# Patient Record
Sex: Female | Born: 1970 | Race: White | Hispanic: No | Marital: Married | Smoking: Never smoker
Health system: Southern US, Community
[De-identification: ages and names within clinical notes are randomized; demographics above are authoritative.]

## PROBLEM LIST (undated history)

## (undated) DIAGNOSIS — M069 Rheumatoid arthritis, unspecified: Secondary | ICD-10-CM

## (undated) DIAGNOSIS — K5792 Diverticulitis of intestine, part unspecified, without perforation or abscess without bleeding: Secondary | ICD-10-CM

## (undated) HISTORY — PX: KNEE ARTHROSCOPY: SUR90

## (undated) HISTORY — DX: Rheumatoid arthritis, unspecified: M06.9

## (undated) HISTORY — DX: Diverticulitis of intestine, part unspecified, without perforation or abscess without bleeding: K57.92

---

## 1998-07-07 ENCOUNTER — Ambulatory Visit (HOSPITAL_COMMUNITY): Admission: RE | Admit: 1998-07-07 | Discharge: 1998-07-07 | Payer: Self-pay | Admitting: Obstetrics and Gynecology

## 1998-09-26 ENCOUNTER — Inpatient Hospital Stay (HOSPITAL_COMMUNITY): Admission: AD | Admit: 1998-09-26 | Discharge: 1998-09-28 | Payer: Self-pay | Admitting: Obstetrics and Gynecology

## 1998-09-29 ENCOUNTER — Encounter (HOSPITAL_COMMUNITY): Admission: RE | Admit: 1998-09-29 | Discharge: 1998-12-28 | Payer: Self-pay | Admitting: Obstetrics and Gynecology

## 1998-10-29 ENCOUNTER — Other Ambulatory Visit: Admission: RE | Admit: 1998-10-29 | Discharge: 1998-10-29 | Payer: Self-pay | Admitting: Obstetrics & Gynecology

## 1999-12-18 ENCOUNTER — Other Ambulatory Visit: Admission: RE | Admit: 1999-12-18 | Discharge: 1999-12-18 | Payer: Self-pay | Admitting: Obstetrics and Gynecology

## 2001-01-04 ENCOUNTER — Other Ambulatory Visit: Admission: RE | Admit: 2001-01-04 | Discharge: 2001-01-04 | Payer: Self-pay | Admitting: Obstetrics and Gynecology

## 2001-01-06 ENCOUNTER — Encounter: Admission: RE | Admit: 2001-01-06 | Discharge: 2001-01-06 | Payer: Self-pay | Admitting: Obstetrics and Gynecology

## 2001-01-06 ENCOUNTER — Encounter: Payer: Self-pay | Admitting: Obstetrics and Gynecology

## 2001-07-04 ENCOUNTER — Encounter: Payer: Self-pay | Admitting: Obstetrics and Gynecology

## 2001-07-04 ENCOUNTER — Encounter: Admission: RE | Admit: 2001-07-04 | Discharge: 2001-07-04 | Payer: Self-pay | Admitting: Obstetrics and Gynecology

## 2002-02-14 ENCOUNTER — Other Ambulatory Visit: Admission: RE | Admit: 2002-02-14 | Discharge: 2002-02-14 | Payer: Self-pay | Admitting: Obstetrics and Gynecology

## 2003-08-04 ENCOUNTER — Inpatient Hospital Stay (HOSPITAL_COMMUNITY): Admission: AD | Admit: 2003-08-04 | Discharge: 2003-08-07 | Payer: Self-pay | Admitting: Obstetrics and Gynecology

## 2003-09-03 ENCOUNTER — Other Ambulatory Visit: Admission: RE | Admit: 2003-09-03 | Discharge: 2003-09-03 | Payer: Self-pay | Admitting: Obstetrics & Gynecology

## 2004-09-02 ENCOUNTER — Other Ambulatory Visit: Admission: RE | Admit: 2004-09-02 | Discharge: 2004-09-02 | Payer: Self-pay | Admitting: Obstetrics and Gynecology

## 2016-03-23 ENCOUNTER — Ambulatory Visit: Payer: Self-pay | Admitting: Rheumatology

## 2016-04-08 DIAGNOSIS — M0609 Rheumatoid arthritis without rheumatoid factor, multiple sites: Secondary | ICD-10-CM | POA: Insufficient documentation

## 2016-04-08 DIAGNOSIS — M19049 Primary osteoarthritis, unspecified hand: Secondary | ICD-10-CM | POA: Insufficient documentation

## 2016-04-08 DIAGNOSIS — Z79899 Other long term (current) drug therapy: Secondary | ICD-10-CM | POA: Insufficient documentation

## 2016-04-08 NOTE — Progress Notes (Signed)
Office Visit Note  Patient: Michelle Pierce             Date of Birth: 08-08-1970           MRN: [id number]             PCP: Pcp Not In System Referring: No ref. provider found Visit Date: 04/13/2016 Occupation: @GUAROCC @    Subjective:  Left foot pain   History of Present Illness: Michelle CopaKimberly D Kutch is a 45 y.o. female with history of seronegative rheumatoid arthritis. She states she's been doing well except for an episode when she developed pain and swelling in her left second toe. She had some leftover prednisone which she took. She does not have any joint pain or swelling now. She denies morning stiffness or nocturnal pain. She states she does experience some fatigue and dry eyes and dry mouth.   Activities of Daily Living:  Patient reports morning stiffness forminutes.5   Patient Denies nocturnal pain.  Difficulty dressing/grooming: Denies Difficulty climbing stairs: Denies Difficulty getting out of chair: Denies Difficulty using hands for taps, buttons, cutlery, and/or writing: Denies   Review of Systems  Constitutional: Positive for fatigue. Negative for night sweats, weight gain, weight loss and weakness.  HENT: Positive for mouth dryness. Negative for mouth sores, trouble swallowing, trouble swallowing and nose dryness.   Eyes: Positive for dryness. Negative for pain, redness and visual disturbance.  Respiratory: Negative for cough, shortness of breath and difficulty breathing.   Cardiovascular: Negative for chest pain, palpitations, hypertension, irregular heartbeat and swelling in legs/feet.  Gastrointestinal: Negative for blood in stool, constipation and diarrhea.  Endocrine: Negative for increased urination.  Genitourinary: Negative for vaginal dryness.  Musculoskeletal: Positive for arthralgias and joint pain. Negative for joint swelling, myalgias, muscle weakness, morning stiffness, muscle tenderness and myalgias.  Skin: Negative for color change, rash, hair  loss, skin tightness, ulcers and sensitivity to sunlight.  Allergic/Immunologic: Negative for susceptible to infections.  Neurological: Negative for dizziness, memory loss and night sweats.  Hematological: Negative for swollen glands.  Psychiatric/Behavioral: Negative for depressed mood and sleep disturbance. The patient is not nervous/anxious.     PMFS History:  Patient Active Problem List   Diagnosis Date Noted  . Rheumatoid arthritis of multiple sites without rheumatoid factor (HCC) 04/08/2016  . High risk medication use 04/08/2016  . Osteoarthritis, 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    Past Medical History:  Diagnosis Date  . Rheumatoid arthritis (HCC)     Family History  Problem Relation Age of Onset  . Hypertension Mother   . Cancer Father    Past Surgical History:  Procedure Laterality Date  . KNEE ARTHROSCOPY Bilateral    Social History   Social History Narrative  . No narrative on file     Objective: Vital Signs: BP 106/67 (BP Location: Left Arm, Patient Position: Sitting, Cuff Size: Small)   Pulse 74   Resp 14   Ht 5\' 6"  (1.676 m)   Wt 154 lb (69.9 kg)   LMP 03/28/2016   BMI 24.86 kg/m    Physical Exam  Constitutional: She is oriented to person, place, and time. She appears well-developed and well-nourished.  HENT:  Head: Normocephalic and atraumatic.  Eyes: Conjunctivae and EOM are normal.  Neck: Normal range of motion.  Cardiovascular: Normal rate, regular rhythm, normal heart sounds and intact distal pulses.   Pulmonary/Chest: Effort normal and breath sounds normal.  Abdominal: Soft. Bowel sounds are normal.  Lymphadenopathy:    She has no cervical  adenopathy.  Neurological: She is alert and oriented to person, place, and time.  Skin: Skin is warm and dry. Capillary refill takes less than 2 seconds.  Psychiatric: She has a normal mood and affect. Her behavior is normal.  Nursing note and vitals reviewed.    Musculoskeletal Exam: C-spine, thoracic, lumbar spine  good range of motion. Shoulder joints, elbow joints, wrist joints, MCPs PIPs DIPs with good range of motion with no synovitis hip joints, knee joints, ankle joints with good range of motion. She has some tenderness over her left second MTP joint with no synovitis was noted.  CDAI Exam: CDAI Homunculus Exam:   Swelling:  Left foot: 2nd MTP  Joint Counts:  CDAI Tender Joint count: 0 CDAI Swollen Joint count: 0  Global Assessments:  Patient Global Assessment: 2 Provider Global Assessment: 2  CDAI Calculated Score: 4    Investigation: Findings:  Seronegative rheumatoid arthritis. CBC CMP and TB gold 02/11/16 normal     Imaging: No results found.  Speciality Comments: No specialty comments available.    Procedures:  No procedures performed Allergies: Sulfa antibiotics; Factive [gemifloxacin]; and Penicillins   Assessment / Plan: Visit Diagnoses: Rheumatoid arthritis of multiple sites without rheumatoid factor (HCC): She has some no synovitis on examination today she had recent episode foot pain and discomfort in her left second MTP joint. We have decreased her methotrexate to 4 tablets per week I discussed the option of increasing the methotrexate but she would like to wait at this point.  High risk medication use - Enbrel MTX 2.5mg  tablets (4/wk) . Her labs are stable. We'll continue to monitor her labs a standing orders were given to check labs every 3 months.  Sicca symptoms she's been having dry mouth and dry eyes for which over-the-counter products were discussed.   Orders: Orders Placed This Encounter  Procedures  . CBC with Differential/Platelet  . COMPLETE METABOLIC PANEL WITH GFR   No orders of the defined types were placed in this encounter.   Face-to-face time spent with patient was 30 minutes. 50% of time was spent in counseling and coordination of care.  Follow-Up Instructions: Return in about 5 months (around 09/11/2016) for Rheumatoid  arthritis.  Pollyann SavoyShaili Arthurine Oleary, MD

## 2016-04-13 ENCOUNTER — Encounter: Payer: Self-pay | Admitting: Rheumatology

## 2016-04-13 ENCOUNTER — Ambulatory Visit (INDEPENDENT_AMBULATORY_CARE_PROVIDER_SITE_OTHER): Payer: Managed Care, Other (non HMO) | Admitting: Rheumatology

## 2016-04-13 VITALS — BP 106/67 | HR 74 | Resp 14 | Ht 66.0 in | Wt 154.0 lb

## 2016-04-13 DIAGNOSIS — M79672 Pain in left foot: Secondary | ICD-10-CM

## 2016-04-13 DIAGNOSIS — M0609 Rheumatoid arthritis without rheumatoid factor, multiple sites: Secondary | ICD-10-CM | POA: Diagnosis not present

## 2016-04-13 DIAGNOSIS — M19042 Primary osteoarthritis, left hand: Secondary | ICD-10-CM

## 2016-04-13 DIAGNOSIS — Z79899 Other long term (current) drug therapy: Secondary | ICD-10-CM

## 2016-04-13 DIAGNOSIS — M19041 Primary osteoarthritis, right hand: Secondary | ICD-10-CM | POA: Diagnosis not present

## 2016-04-13 NOTE — Patient Instructions (Signed)
Standing Labs We placed an order today for your standing lab work.    Please come back and get your standing labs in December 2017 and every 3 months  We have open lab Monday through Friday from 8:30-11:30 AM and 1-4 PM at the office of Dr. Arbutus PedShaili Deveshwar/Naitik Panwala, PA.   The office is located at [address], [address], [place], [place] [address] No appointment is necessary.   Labs are drawn by [facility].  You may receive a bill from [facility] for your lab work.

## 2016-05-08 ENCOUNTER — Other Ambulatory Visit: Payer: Self-pay | Admitting: Rheumatology

## 2016-05-10 NOTE — Telephone Encounter (Signed)
Last Visit: 04/13/16 Next Visit: 09/15/16 Labs: 02/12/16 WNL Left message to remind patient she is due for labs this month.   Okay to refill MTX?

## 2016-05-10 NOTE — Telephone Encounter (Signed)
ok 

## 2016-05-14 ENCOUNTER — Other Ambulatory Visit: Payer: Self-pay | Admitting: *Deleted

## 2016-05-14 ENCOUNTER — Telehealth: Payer: Self-pay | Admitting: Rheumatology

## 2016-05-14 DIAGNOSIS — Z79899 Other long term (current) drug therapy: Secondary | ICD-10-CM

## 2016-05-14 NOTE — Telephone Encounter (Signed)
Patient needs a lab order faxed to her PCP's office at [phone].

## 2016-05-14 NOTE — Telephone Encounter (Signed)
Lab orders have been faxed to PCP.  

## 2016-08-05 ENCOUNTER — Other Ambulatory Visit: Payer: Self-pay | Admitting: Rheumatology

## 2016-08-05 NOTE — Telephone Encounter (Signed)
Last Visit: 04/13/16 Next Visit: 09/15/16 Labs: 05/14/16 WNL  Okay to refill MTX?

## 2016-08-05 NOTE — Telephone Encounter (Signed)
Ok, labs to this month

## 2016-08-05 NOTE — Telephone Encounter (Signed)
Patient reminded she is due for labs this month.

## 2016-08-11 ENCOUNTER — Encounter: Payer: Self-pay | Admitting: Rheumatology

## 2016-08-13 ENCOUNTER — Telehealth: Payer: Self-pay | Admitting: *Deleted

## 2016-08-13 ENCOUNTER — Telehealth: Payer: Self-pay

## 2016-08-13 NOTE — Telephone Encounter (Signed)
Patient advised CMP/CBC which were drawn on 08/11/16 are WNL. Patient verbalized understanding.

## 2016-08-13 NOTE — Telephone Encounter (Signed)
Received a fax from [facility] regarding a prior authorization approval for Enbrel from 08/12/16 to 08/13/18.   Phone [phone]  Will send document to scan center.  Spoke with patient to update her on the results. She voiced understanding and denies and questions at this time.   Reeves Musick, [place], CPhT  8:40 AM

## 2016-09-02 DIAGNOSIS — M3500 Sicca syndrome, unspecified: Secondary | ICD-10-CM | POA: Insufficient documentation

## 2016-09-02 NOTE — Progress Notes (Deleted)
   Office Visit Note  Patient: Michelle Pierce             Date of Birth: 06/20/1970           MRN: [id number]             PCP: Pcp Not In System Referring: No ref. provider found Visit Date: 09/15/2016 Occupation: @    Subjective:  No chief complaint on file.   History of Present Illness: Michelle Pierce is a 46 y.o. female ***   Activities of Daily Living:  Patient reports morning stiffness for *** {minute/hour:19697}.   Patient {ACTIONS;DENIES/REPORTS:21021675::"Denies"} nocturnal pain.  Difficulty dressing/grooming: {ACTIONS;DENIES/REPORTS:21021675::"Denies"} Difficulty climbing stairs: {ACTIONS;DENIES/REPORTS:21021675::"Denies"} Difficulty getting out of chair: {ACTIONS;DENIES/REPORTS:21021675::"Denies"} Difficulty using hands for taps, buttons, cutlery, and/or writing: {ACTIONS;DENIES/REPORTS:21021675::"Denies"}   No Rheumatology ROS completed.   PMFS History:  Patient Active Problem List   Diagnosis Date Noted  . Sicca syndrome, unspecified (HCC) 09/02/2016  . Rheumatoid arthritis of multiple sites without rheumatoid factor (HCC) 04/08/2016  . High risk medication use 04/08/2016  . Osteoarthritis, 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    Past Medical History:  Diagnosis Date  . Rheumatoid arthritis (HCC)     Family History  Problem Relation Age of Onset  . Hypertension Mother   . Cancer Father    Past Surgical History:  Procedure Laterality Date  . KNEE ARTHROSCOPY Bilateral    Social History   Social History Narrative  . No narrative on file     Objective: Vital Signs: There were no vitals taken for this visit.   Physical Exam   Musculoskeletal Exam: ***  CDAI Exam: No CDAI exam completed.    Investigation: Findings:  CBC CMP from PCP 08/11/2016 WNL (Media tab) TB gold negative 02/11/2016    Imaging: No results found.  Speciality Comments: No specialty comments available.    Procedures:  No procedures performed Allergies: Sulfa  antibiotics; Factive [gemifloxacin]; and Penicillins   Assessment / Plan:     Visit Diagnoses: Rheumatoid arthritis of multiple sites without rheumatoid factor (HCC)  High risk medication use - Methotrexate 4 tablets by mouth every week, Enbrel 50 mg by mouth every week, folic acid 1 mg by mouth daily  Sicca syndrome, unspecified (HCC)  Primary osteoarthritis of both hands    Orders: No orders of the defined types were placed in this encounter.  No orders of the defined types were placed in this encounter.   Face-to-face time spent with patient was *** minutes. 50% of time was spent in counseling and coordination of care.  Follow-Up Instructions: No Follow-up on file.   Pollyann Savoy, MD  Note - This record has been created using Animal nutritionist.  Chart creation errors have been sought, but may not always  have been located. Such creation errors do not reflect on  the standard of medical care.

## 2016-09-15 ENCOUNTER — Ambulatory Visit: Payer: Managed Care, Other (non HMO) | Admitting: Rheumatology

## 2016-10-07 NOTE — Progress Notes (Signed)
Office Visit Note  Patient: Michelle Pierce             Date of Birth: Sep 04, 1970           MRN: [id number]             PCP: System, Pcp Not In Referring: No ref. provider found Visit Date: 10/15/2016 Occupation: @GUAROCC @    Subjective:  Pain left foot.   History of Present Illness: Michelle Pierce is a [age] y.o. female with history of sero positive rheumatoid arthritis. She states she's continues to have some discomfort in her left second toe. It swells intermittently. None of the other joints are painful. She's been tolerating her medications well. She continues to have sicca symptoms. She has extremely dry eyes. She's been having increased lower back discomfort. She denies any radiculopathy. She went to [facility] for evaluation of her lower back. According to patient she had x-ray of her lumbar spine and she was told that it was unremarkable.  Activities of Daily Living:  Patient reports morning stiffness for 0 minute.   Patient Denies nocturnal pain.  Difficulty dressing/grooming: Denies Difficulty climbing stairs: Denies Difficulty getting out of chair: Denies Difficulty using hands for taps, buttons, cutlery, and/or writing: Denies   Review of Systems  Constitutional: Positive for fatigue. Negative for night sweats, weight gain, weight loss and weakness.  HENT: Positive for mouth dryness. Negative for mouth sores, trouble swallowing, trouble swallowing and nose dryness.   Eyes: Positive for dryness. Negative for pain, redness and visual disturbance.  Respiratory: Negative for cough, shortness of breath and difficulty breathing.   Cardiovascular: Negative for chest pain, palpitations, hypertension, irregular heartbeat and swelling in legs/feet.  Gastrointestinal: Negative for blood in stool, constipation and diarrhea.  Endocrine: Negative for increased urination.  Genitourinary: Negative for vaginal dryness.  Musculoskeletal: Positive for arthralgias and  joint pain. Negative for joint swelling, myalgias, muscle weakness, morning stiffness, muscle tenderness and myalgias.  Skin: Negative for color change, rash, hair loss, skin tightness, ulcers and sensitivity to sunlight.  Allergic/Immunologic: Negative for susceptible to infections.  Neurological: Negative for dizziness, memory loss and night sweats.  Hematological: Negative for swollen glands.  Psychiatric/Behavioral: Negative for depressed mood and sleep disturbance. The patient is not nervous/anxious.     PMFS History:  Patient Active Problem List   Diagnosis Date Noted  . Sicca syndrome, unspecified (HCC) 09/02/2016  . Rheumatoid arthritis of multiple sites without rheumatoid factor (HCC) 04/08/2016  . High risk medication use 04/08/2016  . Osteoarthritis, 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    Past Medical History:  Diagnosis Date  . Rheumatoid arthritis (HCC)     Family History  Problem Relation Age of Onset  . Hypertension Mother   . Cancer Father    Past Surgical History:  Procedure Laterality Date  . KNEE ARTHROSCOPY Bilateral    Social History   Social History Narrative  . No narrative on file     Objective: Vital Signs: BP 110/69 (BP Location: Left Arm, Patient Position: Sitting, Cuff Size: Large)   Pulse 68   Resp 12   Ht 5\' 6"  (1.676 m)   Wt 156 lb (70.8 kg)   LMP 10/01/2016 (Approximate)   BMI 25.18 kg/m    Physical Exam  Constitutional: She is oriented to person, place, and time. She appears well-developed and well-nourished.  HENT:  Head: Normocephalic and atraumatic.  Eyes: Conjunctivae and EOM are normal.  Neck: Normal range of motion.  Cardiovascular: Normal rate, regular  rhythm, normal heart sounds and intact distal pulses.   Pulmonary/Chest: Effort normal and breath sounds normal.  Abdominal: Soft. Bowel sounds are normal.  Lymphadenopathy:    She has no cervical adenopathy.  Neurological: She is alert and oriented to person, place, and time.  Skin: Skin is  warm and dry. Capillary refill takes less than 2 seconds.  Psychiatric: She has a normal mood and affect. Her behavior is normal.  Nursing note and vitals reviewed.    Musculoskeletal Exam: C-spine and thoracic lumbar spine good range of motion. Shoulder joints although joints wrist joint MCPs PIPs DIPs are good range of motion with no synovitis. Hip joints knee joints ankles MTPs PIPs are good range of motion. She has tenderness on palpation over left second and fifth MTP joint. No synovitis was noted.  CDAI Exam: CDAI Homunculus Exam:   Tenderness:  Left foot: 2nd MTP and 5th MTP  Joint Counts:  CDAI Tender Joint count: 0 CDAI Swollen Joint count: 0  Global Assessments:  Patient Global Assessment: 3 Provider Global Assessment: 3  CDAI Calculated Score: 6    Investigation: Findings:  08/11/2016 normal CBC / CMP [facility] (media tab)  TB gold 02/11/16 normal     Imaging: No results found.  Speciality Comments: No specialty comments available.    Procedures:  No procedures performed Allergies: Sulfa antibiotics; Factive [gemifloxacin]; and Penicillins   Assessment / Plan:     Visit Diagnoses: Rheumatoid arthritis of multiple sites without rheumatoid factor (HCC): She has no active synovitis today. Although she's been having discomfort in her left foot MTPs. I will schedule ultrasound of her bilateral feet to evaluate this further.  High risk medication use - Enbrel 50 mg subcutaneous every week, methotrexate 4 tablets by mouth every week, folic acid 1 mg by mouth daily -patient decreased her methotrexate every week once back as she was doing well. She was on 7 tablets by mouth every week before. I've advised her to increase her methotrexate to 7 tablets by mouth every week and a new prescription was given. As she is on long-term immunosuppressive therapy we will check TB gold again in September. She will continue to get her labs every 3 months to monitor for drug toxicity.  Plan: Quantiferon tb gold assay (blood)  Sicca syndrome, unspecified (HCC): She continues to have sicca symptoms. She has tried pilocarpine in the past and discontinued. We discussed resuming pilocarpine and also trying over-the-counter medications.  Primary osteoarthritis of both hands: Joint protection and muscle strengthening discussed.  Pain in both feet: I would see response to the increased dose of methotrexate and also we'll schedule ultrasound of her bilateral feet to evaluate further. Proper fitting shoes were discussed.  Chronic midline low back pain without sciatica: Probably related to underlying scoliosis of given her lumbar spine exercises.  Thoracogenic scoliosis of thoracolumbar region    Orders: Orders Placed This Encounter  Procedures  . Quantiferon tb gold assay (blood)   Meds ordered this encounter  Medications  . methotrexate (RHEUMATREX) 2.5 MG tablet    Sig: 7 tablets by mouth every week Caution:Chemotherapy. Protect from light.    Dispense:  84 tablet    Refill:  0    Face-to-face time spent with patient was 30 minutes. 50% of time was spent in counseling and coordination of care.  Follow-Up Instructions: Return in about 5 months (around 03/17/2017) for Rheumatoid arthritis.   Pollyann Savoy, MD  Note - This record has been created using Animal nutritionist.  Chart  creation errors have been sought, but may not always  have been located. Such creation errors do not reflect on  the standard of medical care.

## 2016-10-15 ENCOUNTER — Ambulatory Visit (INDEPENDENT_AMBULATORY_CARE_PROVIDER_SITE_OTHER): Payer: Managed Care, Other (non HMO) | Admitting: Rheumatology

## 2016-10-15 ENCOUNTER — Encounter: Payer: Self-pay | Admitting: Rheumatology

## 2016-10-15 VITALS — BP 110/69 | HR 68 | Resp 12 | Ht 66.0 in | Wt 156.0 lb

## 2016-10-15 DIAGNOSIS — Z79899 Other long term (current) drug therapy: Secondary | ICD-10-CM | POA: Diagnosis not present

## 2016-10-15 DIAGNOSIS — M79672 Pain in left foot: Secondary | ICD-10-CM

## 2016-10-15 DIAGNOSIS — M545 Low back pain: Secondary | ICD-10-CM | POA: Diagnosis not present

## 2016-10-15 DIAGNOSIS — M0609 Rheumatoid arthritis without rheumatoid factor, multiple sites: Secondary | ICD-10-CM

## 2016-10-15 DIAGNOSIS — M19041 Primary osteoarthritis, right hand: Secondary | ICD-10-CM | POA: Diagnosis not present

## 2016-10-15 DIAGNOSIS — M19042 Primary osteoarthritis, left hand: Secondary | ICD-10-CM

## 2016-10-15 DIAGNOSIS — G8929 Other chronic pain: Secondary | ICD-10-CM | POA: Diagnosis not present

## 2016-10-15 DIAGNOSIS — M79671 Pain in right foot: Secondary | ICD-10-CM | POA: Diagnosis not present

## 2016-10-15 DIAGNOSIS — M4135 Thoracogenic scoliosis, thoracolumbar region: Secondary | ICD-10-CM

## 2016-10-15 DIAGNOSIS — M3500 Sicca syndrome, unspecified: Secondary | ICD-10-CM | POA: Diagnosis not present

## 2016-10-15 MED ORDER — METHOTREXATE 2.5 MG PO TABS: ORAL_TABLET | ORAL | 0 refills | Status: DC

## 2016-10-15 NOTE — Patient Instructions (Addendum)
Standing Labs We placed an order today for your standing lab work.    Please come back and get your standing labs in June and every 3 months TB Gold is due in September  We have open lab Monday through Friday from 8:30-11:30 AM and 1:30-4 PM at the office of Dr. Arbutus PedShaili Yuvaan Olander/Naitik Panwala, PA.   The office is located at [address], [address], [place], [place] [address] No appointment is necessary.   Labs are drawn by [facility].  You may receive a bill from [facility] for your lab work. If you have any questions regarding directions or hours of operation,  please call [phone].   Back Exercises The following exercises strengthen the muscles that help to support the back. They also help to keep the lower back flexible. Doing these exercises can help to prevent back pain or lessen existing pain. If you have back pain or discomfort, try doing these exercises 2-3 times each day or as told by your health care provider. When the pain goes away, do them once each day, but increase the number of times that you repeat the steps for each exercise (do more repetitions). If you do not have back pain or discomfort, do these exercises once each day or as told by your health care provider. Exercises Single Knee to Chest   Repeat these steps 3-5 times for each leg: 1. Lie on your back on a firm bed or the floor with your legs extended. 2. Bring one knee to your chest. Your other leg should stay extended and in contact with the floor. 3. Hold your knee in place by grabbing your knee or thigh. 4. Pull on your knee until you feel a gentle stretch in your lower back. 5. Hold the stretch for 10-30 seconds. 6. Slowly release and straighten your leg. Pelvic Tilt   Repeat these steps 5-10 times: 1. Lie on your back on a firm bed or the floor with your legs extended. 2. Bend your knees so they are pointing toward the ceiling and your feet are flat on the floor. 3. Tighten your lower abdominal muscles to  press your lower back against the floor. This motion will tilt your pelvis so your tailbone points up toward the ceiling instead of pointing to your feet or the floor. 4. With gentle tension and even breathing, hold this position for 5-10 seconds. Cat-Cow   Repeat these steps until your lower back becomes more flexible: 1. Get into a hands-and-knees position on a firm surface. Keep your hands under your shoulders, and keep your knees under your hips. You may place padding under your knees for comfort. 2. Let your head hang down, and point your tailbone toward the floor so your lower back becomes rounded like the back of a cat. 3. Hold this position for 5 seconds. 4. Slowly lift your head and point your tailbone up toward the ceiling so your back forms a sagging arch like the back of a cow. 5. Hold this position for 5 seconds. Press-Ups   Repeat these steps 5-10 times: 1. Lie on your abdomen (face-down) on the floor. 2. Place your palms near your head, about shoulder-width apart. 3. While you keep your back as relaxed as possible and keep your hips on the floor, slowly straighten your arms to raise the top half of your body and lift your shoulders. Do not use your back muscles to raise your upper torso. You may adjust the placement of your hands to make yourself  more comfortable. 4. Hold this position for 5 seconds while you keep your back relaxed. 5. Slowly return to lying flat on the floor. Bridges   Repeat these steps 10 times: 1. Lie on your back on a firm surface. 2. Bend your knees so they are pointing toward the ceiling and your feet are flat on the floor. 3. Tighten your buttocks muscles and lift your buttocks off of the floor until your waist is at almost the same height as your knees. You should feel the muscles working in your buttocks and the back of your thighs. If you do not feel these muscles, slide your feet 1-2 inches farther away from your buttocks. 4. Hold this position for  3-5 seconds. 5. Slowly lower your hips to the starting position, and allow your buttocks muscles to relax completely. If this exercise is too easy, try doing it with your arms crossed over your chest. Abdominal Crunches   Repeat these steps 5-10 times: 1. Lie on your back on a firm bed or the floor with your legs extended. 2. Bend your knees so they are pointing toward the ceiling and your feet are flat on the floor. 3. Cross your arms over your chest. 4. Tip your chin slightly toward your chest without bending your neck. 5. Tighten your abdominal muscles and slowly raise your trunk (torso) high enough to lift your shoulder blades a tiny bit off of the floor. Avoid raising your torso higher than that, because it can put too much stress on your low back and it does not help to strengthen your abdominal muscles. 6. Slowly return to your starting position. Back Lifts  Repeat these steps 5-10 times: 1. Lie on your abdomen (face-down) with your arms at your sides, and rest your forehead on the floor. 2. Tighten the muscles in your legs and your buttocks. 3. Slowly lift your chest off of the floor while you keep your hips pressed to the floor. Keep the back of your head in line with the curve in your back. Your eyes should be looking at the floor. 4. Hold this position for 3-5 seconds. 5. Slowly return to your starting position. Contact a health care provider if:  Your back pain or discomfort gets much worse when you do an exercise.  Your back pain or discomfort does not lessen within 2 hours after you exercise. If you have any of these problems, stop doing these exercises right away. Do not do them again unless your health care provider says that you can. Get help right away if:  You develop sudden, severe back pain. If this happens, stop doing the exercises right away. Do not do them again unless your health care provider says that you can. This information is not intended to replace advice  given to you by your health care provider. Make sure you discuss any questions you have with your health care provider. Document Released: 06/17/2004 Document Revised: 09/17/2015 Document Reviewed: 07/04/2014 Elsevier Interactive Patient Education  2017 [employer].

## 2016-10-25 ENCOUNTER — Other Ambulatory Visit: Payer: Self-pay | Admitting: Rheumatology

## 2016-10-25 NOTE — Telephone Encounter (Signed)
Last Visit: 10/15/16 Next Visit: 01/05/17 Labs: 08/11/16 WNL TB Gold: 02/11/16 WNL  Okay to refill Enbrel?

## 2016-10-25 NOTE — Telephone Encounter (Signed)
ok 

## 2016-11-17 ENCOUNTER — Telehealth: Payer: Self-pay | Admitting: Rheumatology

## 2016-11-17 ENCOUNTER — Other Ambulatory Visit: Payer: Self-pay | Admitting: *Deleted

## 2016-11-17 DIAGNOSIS — Z79899 Other long term (current) drug therapy: Secondary | ICD-10-CM

## 2016-11-17 NOTE — Telephone Encounter (Signed)
Patient going to have labs drawn tomorrow or Friday at [facility] in [place]. Request orders to be sent. Call patient if any questions, or concerns.

## 2016-11-17 NOTE — Telephone Encounter (Signed)
Lab results released and faxed.  

## 2016-12-30 DIAGNOSIS — Z8739 Personal history of other diseases of the musculoskeletal system and connective tissue: Secondary | ICD-10-CM | POA: Insufficient documentation

## 2016-12-30 DIAGNOSIS — R7989 Other specified abnormal findings of blood chemistry: Secondary | ICD-10-CM | POA: Insufficient documentation

## 2016-12-30 DIAGNOSIS — R945 Abnormal results of liver function studies: Secondary | ICD-10-CM

## 2016-12-30 DIAGNOSIS — M160 Bilateral primary osteoarthritis of hip: Secondary | ICD-10-CM | POA: Insufficient documentation

## 2016-12-30 NOTE — Progress Notes (Signed)
Office Visit Note  Patient: Michelle Pierce             Date of Birth: 28-Sep-1970           MRN: [id number]             PCP: Hal Morales, NP Referring: No ref. provider found Visit Date: 01/05/2017 Occupation: @GUAROCC @    Subjective:  Medication Management, left hip pain, LBP.   History of Present Illness: Michelle Pierce is a 46 y.o. female with history of seronegative rheumatoid arthritis and osteoarthritis. She states she's been doing quite well on current combination of medications. She denies any joint swelling. She's been having some discomfort in her lower back and in her left hip. Her sicca symptoms are manageable.  Activities of Daily Living:  Patient reports morning stiffness for0  minute.   Patient Denies nocturnal pain.  Difficulty dressing/grooming: Denies Difficulty climbing stairs: Denies Difficulty getting out of chair: Denies Difficulty using hands for taps, buttons, cutlery, and/or writing: Denies   Review of Systems  Constitutional: Negative for fatigue, night sweats, weight gain, weight loss and weakness.  HENT: Negative for mouth sores, trouble swallowing, trouble swallowing, mouth dryness and nose dryness.   Eyes: Negative for pain, redness, visual disturbance and dryness.  Respiratory: Negative for cough, shortness of breath and difficulty breathing.   Cardiovascular: Negative for chest pain, palpitations, hypertension, irregular heartbeat and swelling in legs/feet.  Gastrointestinal: Negative for blood in stool, constipation and diarrhea.  Endocrine: Negative for increased urination.  Genitourinary: Negative for vaginal dryness.  Musculoskeletal: Positive for arthralgias and joint pain. Negative for joint swelling, myalgias, muscle weakness, morning stiffness, muscle tenderness and myalgias.  Skin: Negative for color change, rash, hair loss, skin tightness, ulcers and sensitivity to sunlight.  Allergic/Immunologic: Negative for susceptible to  infections.  Neurological: Negative for dizziness, memory loss and night sweats.  Hematological: Negative for swollen glands.  Psychiatric/Behavioral: Negative for depressed mood and sleep disturbance. The patient is not nervous/anxious.     PMFS History:  Patient Active Problem List   Diagnosis Date Noted  . Primary osteoarthritis of both hips 12/30/2016  . History of scoliosis 12/30/2016  . Elevated LFTs 12/30/2016  . Sicca syndrome, unspecified (HCC) 09/02/2016  . Rheumatoid arthritis of multiple sites without rheumatoid factor (HCC) 04/08/2016  . High risk medication use 04/08/2016  . Osteoarthritis, 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    Past Medical History:  Diagnosis Date  . Rheumatoid arthritis (HCC)     Family History  Problem Relation Age of Onset  . Hypertension Mother   . Cancer Father    Past Surgical History:  Procedure Laterality Date  . KNEE ARTHROSCOPY Bilateral    Social History   Social History Narrative  . No narrative on file     Objective: Vital Signs: BP 120/60   Pulse 74   Resp 14   Ht 5\' 5"  (1.651 m)   Wt 148 lb (67.1 kg)   LMP 12/22/2016 (Approximate)   BMI 24.63 kg/m    Physical Exam  Constitutional: She is oriented to person, place, and time. She appears well-developed and well-nourished.  HENT:  Head: Normocephalic and atraumatic.  Eyes: Conjunctivae and EOM are normal.  Neck: Normal range of motion.  Cardiovascular: Normal rate, regular rhythm, normal heart sounds and intact distal pulses.   Pulmonary/Chest: Effort normal and breath sounds normal.  Abdominal: Soft. Bowel sounds are normal.  Lymphadenopathy:    She has no cervical adenopathy.  Neurological: She is alert  and oriented to person, place, and time.  Skin: Skin is warm and dry. Capillary refill takes less than 2 seconds.  Psychiatric: She has a normal mood and affect. Her behavior is normal.  Nursing note and vitals reviewed.    Musculoskeletal Exam: C-spine and thoracic spine good  range of motion. She is some discomfort range of motion of her lumbar spine. She does have scoliosis in her lumbar region. Shoulder joints, elbow joints, wrist joints, MCPs, PIPs, DIPs with good range of motion with no synovitis. She has discomfort range of motion of her left hip joint. Knee joints, ankle joints, MTPs PIPs with good range of motion with no synovitis.   CDAI Exam: CDAI Homunculus Exam:   Joint Counts:  CDAI Tender Joint count: 0 CDAI Swollen Joint count: 0  Global Assessments:  Patient Global Assessment: 2 Provider Global Assessment: 2  CDAI Calculated Score: 4    Investigation: Findings:  02/09/2016 negative Tb gold   08/11/16 outside labs indicate normal CBC CMP ([facility])    Imaging: Xr Hip Unilat W Or W/o Pelvis 2-3 Views Left  Result Date: 01/05/2017 Moderate to severe left hip joint severe medial narrowing. Mild right superior medial narrowing. Impression moderate to severe osteoarthritis of the left hip  Xr Lumbar Spine 2-3 Views  Result Date: 01/05/2017 Lumbar scoliosis and facet joint arthropathy noted. No significant disc space narrowing was noted.   Speciality Comments: No specialty comments available.    Procedures:  No procedures performed Allergies: Sulfa antibiotics; Factive [gemifloxacin]; and Penicillins   Assessment / Plan:     Visit Diagnoses: Rheumatoid arthritis of multiple sites without rheumatoid factor (HCC): She has no synovitis on examination she seems to doing quite well on current medications.  High risk medication use - Enbrel 50 mg subcutaneous every week, methotrexate 4 tablets by mouth every week, folic acid 1 mg by mouth daily. She states she had labs from July do not have it in our system. She will have the labs mailed to us.  Sicca syndrome, unspecified (HCC): She's been using over-the-counter products which is been helpful.  Pain in left hip. She is painful range of motion of her left hip. - Plan: XR HIP  UNILAT W OR W/O PELVIS 2-3 VIEWS LEFT. She has moderate osteoarthritis of the right hip joint and moderate to severe osteoarthritis of the left hip joint.  Chronic midline low back pain without sciatica - Plan: XR Lumbar Spine 2-3 Views showed scoliosis and facet joint arthropathy.  History of scoliosis  Primary osteoarthritis of both hands: Doing well  Primary osteoarthritis of both hips     Orders: Orders Placed This Encounter  Procedures  . XR HIP UNILAT W OR W/O PELVIS 2-3 VIEWS LEFT  . XR Lumbar Spine 2-3 Views   No orders of the defined types were placed in this encounter.   Face-to-face time spent with patient was 30 minutes. 50% of time was spent in counseling and coordination of care.  Follow-Up Instructions: Return in about 5 months (around 06/07/2017) for Rheumatoid arthritis.   Pollyann SavoyShaili Kingslee Mairena, MD  Note - This record has been created using Animal nutritionistDragon software.  Chart creation errors have been sought, but may not always  have been located. Such creation errors do not reflect on  the standard of medical care.

## 2017-01-05 ENCOUNTER — Ambulatory Visit (INDEPENDENT_AMBULATORY_CARE_PROVIDER_SITE_OTHER): Payer: Managed Care, Other (non HMO) | Admitting: Rheumatology

## 2017-01-05 ENCOUNTER — Encounter: Payer: Self-pay | Admitting: Rheumatology

## 2017-01-05 ENCOUNTER — Ambulatory Visit (INDEPENDENT_AMBULATORY_CARE_PROVIDER_SITE_OTHER): Payer: Self-pay

## 2017-01-05 VITALS — BP 120/60 | HR 74 | Resp 14 | Ht 65.0 in | Wt 148.0 lb

## 2017-01-05 DIAGNOSIS — M19042 Primary osteoarthritis, left hand: Secondary | ICD-10-CM | POA: Diagnosis not present

## 2017-01-05 DIAGNOSIS — G8929 Other chronic pain: Secondary | ICD-10-CM | POA: Diagnosis not present

## 2017-01-05 DIAGNOSIS — M545 Low back pain, unspecified: Secondary | ICD-10-CM

## 2017-01-05 DIAGNOSIS — M25552 Pain in left hip: Secondary | ICD-10-CM

## 2017-01-05 DIAGNOSIS — Z8739 Personal history of other diseases of the musculoskeletal system and connective tissue: Secondary | ICD-10-CM | POA: Diagnosis not present

## 2017-01-05 DIAGNOSIS — Z79899 Other long term (current) drug therapy: Secondary | ICD-10-CM | POA: Diagnosis not present

## 2017-01-05 DIAGNOSIS — M160 Bilateral primary osteoarthritis of hip: Secondary | ICD-10-CM | POA: Diagnosis not present

## 2017-01-05 DIAGNOSIS — M19041 Primary osteoarthritis, right hand: Secondary | ICD-10-CM | POA: Diagnosis not present

## 2017-01-05 DIAGNOSIS — M3500 Sicca syndrome, unspecified: Secondary | ICD-10-CM | POA: Diagnosis not present

## 2017-01-05 DIAGNOSIS — M0609 Rheumatoid arthritis without rheumatoid factor, multiple sites: Secondary | ICD-10-CM | POA: Diagnosis not present

## 2017-01-05 NOTE — Patient Instructions (Signed)
Standing Labs We placed an order today for your standing lab work.    Please come back and get your standing labs in October and every 3 months. TB Gold should be drawn with October labs.  We have open lab Monday through Friday from 8:30-11:30 AM and 1:30-4 PM at the office of Dr. Pollyann SavoyShaili Burnice Oestreicher.   The office is located at [address], [address], [place], [place] [address] No appointment is necessary.   Labs are drawn by [facility].  You may receive a bill from [facility] for your lab work. If you have any questions regarding directions or hours of operation,  please call [phone].

## 2017-01-06 ENCOUNTER — Telehealth: Payer: Self-pay | Admitting: Radiology

## 2017-01-06 NOTE — Telephone Encounter (Signed)
Normal labs CBC CMP received from  [facility]  Sent for scan (12/01/16)

## 2017-01-12 ENCOUNTER — Telehealth: Payer: Self-pay | Admitting: Rheumatology

## 2017-01-12 NOTE — Telephone Encounter (Signed)
Patient advised we have received lab results are normal.

## 2017-01-12 NOTE — Telephone Encounter (Signed)
Patient calling to make sure we received lab results from [facility] in [place], from last week.

## 2017-02-01 ENCOUNTER — Other Ambulatory Visit: Payer: Self-pay | Admitting: Rheumatology

## 2017-02-02 NOTE — Telephone Encounter (Signed)
Last visit 01/05/17 Next visit 06/21/17   CBC CMP 12/01/16 WNL  Ok to refill per Dr Corliss Skainseveshwar

## 2017-03-18 ENCOUNTER — Ambulatory Visit: Payer: Managed Care, Other (non HMO) | Admitting: Rheumatology

## 2017-04-06 ENCOUNTER — Other Ambulatory Visit: Payer: Self-pay | Admitting: Rheumatology

## 2017-04-08 NOTE — Telephone Encounter (Addendum)
Last visit: 01/05/2017 Next visit: 06/21/2017 Labs: 12/01/2016 Cr .53 previously .56 Tb Gold:

## 2017-04-11 NOTE — Telephone Encounter (Signed)
Last visit: 01/05/2017 Next visit: 06/21/2017 Labs: 12/01/2016 Cr .53 previously .56 Tb Gold: 02/09/16 Neg   Patient to update labs this week  Okay to refill 30 day supply per Dr. Corliss Skainseveshwar

## 2017-04-13 ENCOUNTER — Telehealth: Payer: Self-pay | Admitting: Rheumatology

## 2017-04-13 ENCOUNTER — Other Ambulatory Visit: Payer: Self-pay | Admitting: *Deleted

## 2017-04-13 DIAGNOSIS — Z79899 Other long term (current) drug therapy: Secondary | ICD-10-CM

## 2017-04-13 NOTE — Telephone Encounter (Signed)
Patient at [facility]. Patient request an order for TB Gold to be faxed there, so she can get labs drawn. Fax# [phone]

## 2017-04-13 NOTE — Telephone Encounter (Signed)
Lab orders faxed.

## 2017-05-04 ENCOUNTER — Other Ambulatory Visit: Payer: Self-pay | Admitting: Rheumatology

## 2017-05-05 NOTE — Telephone Encounter (Signed)
Last visit: 01/05/2017 Next visit: 06/21/2017 Labs: 12/01/2016 Cr .53 previously .56  Patient states she updated her labs with her PCP. Patient will have copy sent to office.  Okay to refill 30 day supply per Dr. Corliss Skainseveshwar

## 2017-06-02 ENCOUNTER — Other Ambulatory Visit: Payer: Self-pay | Admitting: Rheumatology

## 2017-06-03 NOTE — Telephone Encounter (Signed)
Last Visit: 01/05/17 Next Visit: 06/21/17 Labs: 05/04/17 WNL  Okay to refill per Dr. Corliss Skainseveshwar

## 2017-06-07 NOTE — Progress Notes (Signed)
Office Visit Note  Patient: Michelle Pierce             Date of Birth: [birth year]           MRN: [id number]             PCP: Hal Morales, NP Referring: Hal Morales, NP Visit Date: 06/21/2017 Occupation: @GUAROCC @    Subjective:  Dry eyes   History of Present Illness: Michelle Pierce is a 47 y.o. female with history of seronegative rheumatoid arthritis, sicca syndrome, and osteoarthritis.  Patient states she takes MTX 7 tablets weekly, Enbrel once weekly, and folic acid 2 mg daily. She is due for her Enbrel injection today.  She had labs on 04/29/17. She states that this combination of medications have been controlling her rheumatoid arthritis.  She denies any joint pain or joint swelling.  She denies joint stiffness.  She states that her hips have been doing ok, but her left hip causes discomfort at night when laying on her left side.   She states that her left shoulder also causes discomfort when laying on it.  She reports she still has good ROM.  She states that her left shoulder is slightly tender.  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  She denies any injury or fall.  She states her lower back on the left side still causes occasional discomfort.  She denies any radiation of pain or numbness.  She states that her back feels stiff occasionally.        She continues to have sicca symptoms.  She uses Pilocarpine PRN.  She has not been using OTC products.  She has eye dryness daily and increased thirst.      Activities of Daily Living:  Patient reports morning stiffness for  0 minutes.   Patient Reports nocturnal pain.  Difficulty dressing/grooming: Denies Difficulty climbing stairs: Denies Difficulty getting out of chair: Denies Difficulty using hands for taps, buttons, cutlery, and/or writing: Denies   Review of Systems  Constitutional: Positive for fatigue. Negative for weakness.  HENT: Positive for mouth dryness. Negative for mouth sores and  nose dryness.   Eyes: Positive for dryness. Negative for pain, redness and visual disturbance.  Respiratory: Negative for cough, hemoptysis, shortness of breath and difficulty breathing.   Cardiovascular: Negative for chest pain, palpitations, hypertension, irregular heartbeat and swelling in legs/feet.  Gastrointestinal: Negative for blood in stool, constipation and diarrhea.  Endocrine: Negative for increased urination.  Genitourinary: Negative for painful urination.  Musculoskeletal: Positive for arthralgias and joint pain. Negative for joint swelling, myalgias, muscle weakness, morning stiffness, muscle tenderness and myalgias.  Skin: Negative for color change, pallor, rash, hair loss, nodules/bumps, redness, skin tightness, ulcers and sensitivity to sunlight.  Neurological: Positive for headaches. Negative for dizziness and numbness.  Hematological: Negative for swollen glands.  Psychiatric/Behavioral: Negative for depressed mood and sleep disturbance. The patient is nervous/anxious.     PMFS History:  Patient Active Problem List   Diagnosis Date Noted  . Primary osteoarthritis of both hips 12/30/2016  . History of scoliosis 12/30/2016  . Elevated LFTs 12/30/2016  . Sicca syndrome, unspecified (HCC) 09/02/2016  . Rheumatoid arthritis of multiple sites without rheumatoid factor (HCC) 04/08/2016  . High risk medication use 04/08/2016  . Osteoarthritis, 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    Past Medical History:  Diagnosis Date  . Rheumatoid arthritis (HCC)     Family History  Problem Relation Age of Onset  . Hypertension  Mother   . Cancer Father    Past Surgical History:  Procedure Laterality Date  . KNEE ARTHROSCOPY Bilateral    Social History   Social History Narrative  . Not on file     Objective: Vital Signs: BP 110/68 (BP Location: Left Arm, Patient Position: Sitting, Cuff Size: Normal)   Pulse 72   Resp 16   Ht 5\' 5"  (1.651 m)   Wt 166 lb (75.3 kg)   BMI 27.62 kg/m     Physical Exam  Constitutional: She is oriented to person, place, and time. She appears well-developed and well-nourished.  HENT:  Head: Normocephalic and atraumatic.  Eyes: Conjunctivae and EOM are normal.  Neck: Normal range of motion.  Cardiovascular: Normal rate, regular rhythm, normal heart sounds and intact distal pulses.  Pulmonary/Chest: Effort normal and breath sounds normal.  Abdominal: Soft. Bowel sounds are normal.  Lymphadenopathy:    She has no cervical adenopathy.  Neurological: She is alert and oriented to person, place, and time.  Skin: Skin is warm and dry. Capillary refill takes less than 2 seconds.  Psychiatric: She has a normal mood and affect. Her behavior is normal.  Nursing note and vitals reviewed.    Musculoskeletal Exam: C-spine, thoracic, and lumbar spine good ROM.  Shoulder joints, elbow joints, wrists, MCPs, PIPs, and DIPs good ROM with no synovitis.  She has DIP synovial thickening consistent with osteoarthritis.  Hip joints, knee joints, ankle joints, MTPs, PIPs, and DIPs good ROM with no synovitis.  She has bilateral knee crepitus.  No warmth or effusion.  No midline spinal tenderness.  No SI joint tenderness.  She has left trochanteric bursitis.    CDAI Exam: CDAI Homunculus Exam:   Joint Counts:  CDAI Tender Joint count: 0 CDAI Swollen Joint count: 0  Global Assessments:  Patient Global Assessment: 3 Provider Global Assessment: 3  CDAI Calculated Score: 6    Investigation: No additional findings. Labs: 12/01/2016 Creat 0.53  Imaging: No results found.  Speciality Comments: No specialty comments available.    Procedures:  No procedures performed Allergies: Sulfa antibiotics; Factive [gemifloxacin]; and Penicillins   Assessment / Plan:     Visit Diagnoses: Rheumatoid arthritis of multiple sites without rheumatoid factor (HCC): She has no synovitis on exam.  She is clinically doing well on Enbrel subq, MTX 7 tablets weekly, and  folic acid 2 mg.  A refill of Enbrel was sent to the pharmacy.    High risk medication use - Enbrel, MTX, folic acid -CBC and CMP were WNL on 04/29/17. TB gold was performed on 04/13/17.    Sicca syndrome, unspecified (HCC) -  She uses Pilocarpine PRN.  She no longer uses OTC products.  She continues to have dry eyes and increased thirst.  She was encouraged to try biotene products and OTC eye drops.     Primary osteoarthritis of both hands: She has DIP synovial thickening consistent with osteoarthritis.  She has no discomfort at this time.      Primary osteoarthritis of both hips: She has good ROM bilaterally.  She experiences occasional discomfort in her left hip when laying on the left side at night.  She also has trochanteric bursitis on the left side.    Chronic midline low back pain without sciatica: Chronic discomfort.  No midline spinal tenderness.    History of scoliosis    Orders: No orders of the defined types were placed in this encounter.  Meds ordered this encounter  Medications  . etanercept (  ENBREL SURECLICK) 50 MG/ML injection    Sig: INJECT ONE SURECLICK PEN (50 MG) SUBCUTANEOUSLY ONCE A WEEK. REFRIGERATE. DO NOT FREEZE.    Dispense:  11.76 mL    Refill:  0     Follow-Up Instructions: Return in about 5 months (around 11/19/2017) for Rheumatoid arthritis, Osteoarthritis.   Pollyann Savoy, MD  Note - This record has been created using Animal nutritionist.  Chart creation errors have been sought, but may not always  have been located. Such creation errors do not reflect on  the standard of medical care.

## 2017-06-21 ENCOUNTER — Encounter: Payer: Self-pay | Admitting: Rheumatology

## 2017-06-21 ENCOUNTER — Ambulatory Visit: Payer: Managed Care, Other (non HMO) | Admitting: Rheumatology

## 2017-06-21 VITALS — BP 110/68 | HR 72 | Resp 16 | Ht 65.0 in | Wt 166.0 lb

## 2017-06-21 DIAGNOSIS — M3500 Sicca syndrome, unspecified: Secondary | ICD-10-CM

## 2017-06-21 DIAGNOSIS — M19041 Primary osteoarthritis, right hand: Secondary | ICD-10-CM

## 2017-06-21 DIAGNOSIS — M0609 Rheumatoid arthritis without rheumatoid factor, multiple sites: Secondary | ICD-10-CM

## 2017-06-21 DIAGNOSIS — M160 Bilateral primary osteoarthritis of hip: Secondary | ICD-10-CM

## 2017-06-21 DIAGNOSIS — Z79899 Other long term (current) drug therapy: Secondary | ICD-10-CM | POA: Diagnosis not present

## 2017-06-21 DIAGNOSIS — G8929 Other chronic pain: Secondary | ICD-10-CM

## 2017-06-21 DIAGNOSIS — M545 Low back pain, unspecified: Secondary | ICD-10-CM

## 2017-06-21 DIAGNOSIS — Z8739 Personal history of other diseases of the musculoskeletal system and connective tissue: Secondary | ICD-10-CM

## 2017-06-21 DIAGNOSIS — M19042 Primary osteoarthritis, left hand: Secondary | ICD-10-CM | POA: Diagnosis not present

## 2017-06-21 MED ORDER — ETANERCEPT 50 MG/ML ~~LOC~~ SOAJ: SUBCUTANEOUS | 0 refills | Status: DC

## 2017-08-04 ENCOUNTER — Other Ambulatory Visit: Payer: Self-pay | Admitting: Rheumatology

## 2017-08-04 NOTE — Telephone Encounter (Addendum)
Last Visit: 06/21/17 Next Visit: 11/16/17 Labs: 04/29/18 WNL  Left message to advise patient we need updated labs.   Okay to refill 30 day supply per Dr. Corliss Skainseveshwar

## 2017-08-24 ENCOUNTER — Other Ambulatory Visit: Payer: Self-pay | Admitting: Physician Assistant

## 2017-08-24 NOTE — Telephone Encounter (Addendum)
Left message for patient to call the office. Advised she will need to update labs.

## 2017-08-27 ENCOUNTER — Encounter: Payer: Self-pay | Admitting: Rheumatology

## 2017-09-02 ENCOUNTER — Other Ambulatory Visit: Payer: Self-pay | Admitting: Rheumatology

## 2017-09-15 ENCOUNTER — Other Ambulatory Visit: Payer: Self-pay | Admitting: Physician Assistant

## 2017-09-15 NOTE — Telephone Encounter (Addendum)
Last Visit: 06/21/17 Next Visit: 11/16/17 Labs:04/29/17 WNL TB Gold: 04/29/17 Neg  Left message to advise patient we need updated labs. She states she updated labs and will have then faxed.   Okay to refill 30 day supply per Dr. Corliss Skainseveshwar

## 2017-10-03 ENCOUNTER — Other Ambulatory Visit: Payer: Self-pay | Admitting: Rheumatology

## 2017-10-03 NOTE — Telephone Encounter (Signed)
Last Visit: 06/21/17 Next Visit: 11/16/17 Labs: 08/27/17 WNL  Okay to refill per Dr. Corliss Skains

## 2017-10-04 ENCOUNTER — Telehealth: Payer: Self-pay | Admitting: *Deleted

## 2017-10-04 NOTE — Telephone Encounter (Signed)
contacted patient and advised patient based off of labs she should decrease MTX to 6 tabs per week. Patient verbalized understanding.

## 2017-10-10 ENCOUNTER — Other Ambulatory Visit: Payer: Self-pay | Admitting: Rheumatology

## 2017-10-10 NOTE — Telephone Encounter (Addendum)
Last Visit: 06/21/17 Next Visit: 11/16/17 Labs: 08/27/17 WNL TB Gold: 04/29/17 Neg   Okay to refill per Dr. Corliss Skains

## 2017-10-27 ENCOUNTER — Other Ambulatory Visit: Payer: Self-pay | Admitting: Obstetrics and Gynecology

## 2017-10-27 DIAGNOSIS — R928 Other abnormal and inconclusive findings on diagnostic imaging of breast: Secondary | ICD-10-CM

## 2017-10-31 ENCOUNTER — Ambulatory Visit
Admission: RE | Admit: 2017-10-31 | Discharge: 2017-10-31 | Disposition: A | Discharge status: Home / Self Care | Payer: Managed Care, Other (non HMO) | Source: Ambulatory Visit | Attending: Obstetrics and Gynecology | Admitting: Obstetrics and Gynecology

## 2017-10-31 ENCOUNTER — Other Ambulatory Visit: Payer: Self-pay | Admitting: Rheumatology

## 2017-10-31 DIAGNOSIS — R928 Other abnormal and inconclusive findings on diagnostic imaging of breast: Secondary | ICD-10-CM

## 2017-10-31 NOTE — Telephone Encounter (Signed)
Last Visit: 06/21/17 Next Visit: 11/16/17 Labs:08/27/17 WNL   Okay to refill per Dr. Corliss Skainseveshwar

## 2017-11-07 NOTE — Progress Notes (Signed)
Office Visit Note  Patient: Michelle Pierce             Date of Birth: 09-05-1970           MRN: [id number]             PCP: Hal Morales, NP Referring: Hal Morales, NP Visit Date: 11/16/2017 Occupation: @GUAROCC @    Subjective:  Medication management   History of Present Illness: Michelle Pierce is a 47 y.o. female with history of seronegative rheumatoid arthritis and osteoarthritis.  She states she takes her Enbrel approximately every other week and we had to reduce methotrexate to 6 tablets/week due to mild elevation of ALT.  She denies any joint pain or joint swelling.  She continues to have some discomfort in her hip joints due to underlying osteoarthritis.  She was taking pilocarpine for sicca symptoms which she discontinued that she felt it was not effective.  She continues to have sicca symptoms.  She continues to have some numbness which she experiences in her left first second and third digits.  She is on computer during the daytime.  She also complains of some left trapezius spasm.  Just some shortness of breath off and on for the last 2 to 3 months.  Activities of Daily Living:  Patient reports morning stiffness for 0 minute.   Patient Denies nocturnal pain.  Difficulty dressing/grooming: Denies Difficulty climbing stairs: Denies Difficulty getting out of chair: Denies Difficulty using hands for taps, buttons, cutlery, and/or writing: Denies   Review of Systems  Constitutional: Negative for fatigue, fever, night sweats, weight gain and weight loss.  HENT: Negative for ear pain, mouth sores, trouble swallowing, trouble swallowing, mouth dryness and nose dryness.   Eyes: Positive for pain. Negative for redness, visual disturbance and dryness.  Respiratory: Positive for shortness of breath. Negative for cough and difficulty breathing.        Last 2 months  Cardiovascular: Negative for chest pain, palpitations, hypertension, irregular heartbeat and swelling in  legs/feet.  Gastrointestinal: Negative for blood in stool, constipation and diarrhea.  Endocrine: Negative for increased urination.  Genitourinary: Negative for difficulty urinating and vaginal dryness.  Musculoskeletal: Positive for arthralgias, joint pain and muscle weakness. Negative for joint swelling, myalgias, morning stiffness, muscle tenderness and myalgias.  Skin: Negative for color change, rash, hair loss, skin tightness, ulcers and sensitivity to sunlight.  Allergic/Immunologic: Negative for susceptible to infections.  Neurological: Positive for weakness. Negative for dizziness, memory loss and night sweats.  Hematological: Negative for bruising/bleeding tendency and swollen glands.  Psychiatric/Behavioral: Negative for depressed mood and sleep disturbance. The patient is not nervous/anxious.     PMFS History:  Patient Active Problem List   Diagnosis Date Noted  . Primary osteoarthritis of both hips 12/30/2016  . History of scoliosis 12/30/2016  . Elevated LFTs 12/30/2016  . Sicca syndrome, unspecified (HCC) 09/02/2016  . Rheumatoid arthritis of multiple sites without rheumatoid factor (HCC) 04/08/2016  . High risk medication use 04/08/2016  . Osteoarthritis, 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    Past Medical History:  Diagnosis Date  . Rheumatoid arthritis (HCC)     Family History  Problem Relation Age of Onset  . Hypertension Mother   . Cancer Father    Past Surgical History:  Procedure Laterality Date  . KNEE ARTHROSCOPY Bilateral    Social History   Social History Narrative  . Not on file     Objective: Vital Signs: BP 120/83 (BP Location: Right Arm, Patient Position: Sitting,  Cuff Size: Normal)   Pulse 71   Ht 5\' 5"  (1.651 m)   Wt 164 lb (74.4 kg)   BMI 27.29 kg/m    Physical Exam  Constitutional: She is oriented to person, place, and time. She appears well-developed and well-nourished.  HENT:  Head: Normocephalic and atraumatic.  Eyes: Conjunctivae and EOM are  normal.  Neck: Normal range of motion.  Cardiovascular: Normal rate, regular rhythm, normal heart sounds and intact distal pulses.  Pulmonary/Chest: Effort normal and breath sounds normal.  Abdominal: Soft. Bowel sounds are normal.  Lymphadenopathy:    She has no cervical adenopathy.  Neurological: She is alert and oriented to person, place, and time.  Skin: Skin is warm and dry. Capillary refill takes less than 2 seconds.  Psychiatric: She has a normal mood and affect. Her behavior is normal.  Nursing note and vitals reviewed.    Musculoskeletal Exam: C-spine thoracic lumbar spine good range of motion.  Shoulder joints elbow joints wrist joint MCPs PIPs DIPs with good range of motion with no synovitis.  Hip joints knee joints ankles MTPs PIPs DIPs were in good range of motion with no synovitis.  CDAI Exam: CDAI Homunculus Exam:   Joint Counts:  CDAI Tender Joint count: 0 CDAI Swollen Joint count: 0  Global Assessments:  Patient Global Assessment: 3 Provider Global Assessment: 1  CDAI Calculated Score: 4    Investigation: No additional findings.    Imaging: [employer] Breast [employer] Uni Left Inc Axilla  Result Date: 10/31/2017 CLINICAL DATA:  47 year old female for evaluation of possible LEFT breast mass on screening mammogram. EXAM: DIGITAL DIAGNOSTIC LEFT MAMMOGRAM AND TOMO ULTRASOUND LEFT BREAST COMPARISON:  Previous exam(s). ACR Breast Density Category c: The breast tissue is heterogeneously dense, which may obscure small masses. FINDINGS: 2D/3D spot compression views of the LEFT breast demonstrate a faint circumscribed oval mass within the UPPER-OUTER LEFT breast. Targeted ultrasound is performed, showing a 9 x 5 x 8 mm benign simple cyst at the 2 o'clock position of the LEFT breast 7 cm from the nipple, corresponding to the screening study finding. IMPRESSION: Benign cyst within the UPPER-OUTER LEFT breast corresponding to the screening study finding. RECOMMENDATION: Bilateral  screening mammograms in 1 year. I have discussed the findings and recommendations with the patient. Results were also provided in writing at the conclusion of the visit. If applicable, a reminder letter will be sent to the patient regarding the next appointment. BI-RADS CATEGORY  2: Benign. Electronically Signed   By: Harmon PierJeffrey  Hu M.D.   On: 10/31/2017 09:19   Mm Diag Breast Tomo Uni Left  Result Date: 10/31/2017 CLINICAL DATA:  47 year old female for evaluation of possible LEFT breast mass on screening mammogram. EXAM: DIGITAL DIAGNOSTIC LEFT MAMMOGRAM AND TOMO ULTRASOUND LEFT BREAST COMPARISON:  Previous exam(s). ACR Breast Density Category c: The breast tissue is heterogeneously dense, which may obscure small masses. FINDINGS: 2D/3D spot compression views of the LEFT breast demonstrate a faint circumscribed oval mass within the UPPER-OUTER LEFT breast. Targeted ultrasound is performed, showing a 9 x 5 x 8 mm benign simple cyst at the 2 o'clock position of the LEFT breast 7 cm from the nipple, corresponding to the screening study finding. IMPRESSION: Benign cyst within the UPPER-OUTER LEFT breast corresponding to the screening study finding. RECOMMENDATION: Bilateral screening mammograms in 1 year. I have discussed the findings and recommendations with the patient. Results were also provided in writing at the conclusion of the visit. If applicable, a reminder letter will be sent  to the patient regarding the next appointment. BI-RADS CATEGORY  2: Benign. Electronically Signed   By: Harmon Pier M.D.   On: 10/31/2017 09:19    Speciality Comments: No specialty comments available.    Procedures:  No procedures performed Allergies: Sulfa antibiotics; Factive [gemifloxacin]; and Penicillins   Assessment / Plan:     Visit Diagnoses: Rheumatoid arthritis of multiple sites without rheumatoid factor (HCC)-patient has no active synovitis on examination.  Her rheumatoid arthritis seems to be quite well  controlled.  She has been using Enbrel only every other week and has reduced methotrexate to 6 tablets/week due to mild elevation of ALT.  She will continue on current regimen for right now.  High risk medication use - Enbrel subcutaneous injection every other week or weekly, MTX 6 tablets weekly, and folic acid 2 mg daily.  We will get labs today and then every 3 months to monitor for drug toxicity.  She also request hemoglobin A1c should be checked.  Elevated LFTs-patient of LFTs.  We will continue to monitor that.  Paresthesia left hand-Allen's test, Tinel's test was negative.  Manual compression test was negative.  I will schedule nerve conduction velocity to evaluate this further.  Primary osteoarthritis of both hips-she continues to have mild discomfort.  Sicca syndrome, unspecified (HCC)-she has discontinued pilocarpine.  She continues to have some sicca symptoms.  Over-the-counter products were discussed.  She is been experiencing shortness of breath off and on for the last 3 months.  Have advised her to schedule appointment with her PCP.  I will also get a  chest x-ray today.  History of scoliosis    Orders: Orders Placed This Encounter  Procedures  . DG Chest 2 View  . CBC with Differential/Platelet  . COMPLETE METABOLIC PANEL WITH GFR  . HgB A1c  . Ambulatory referral to Physical Medicine Rehab   No orders of the defined types were placed in this encounter.   Face-to-face time spent with patient was 30 minutes. >50% of time was spent in counseling and coordination of care.  Follow-Up Instructions: Return in about 5 months (around 04/18/2018) for Rheumatoid arthritis, Osteoarthritis.   Pollyann Savoy, MD  Note - This record has been created using Animal nutritionist.  Chart creation errors have been sought, but may not always  have been located. Such creation errors do not reflect on  the standard of medical care.

## 2017-11-16 ENCOUNTER — Ambulatory Visit (HOSPITAL_COMMUNITY)
Admission: RE | Admit: 2017-11-16 | Discharge: 2017-11-16 | Disposition: A | Discharge status: Home / Self Care | Payer: Managed Care, Other (non HMO) | Source: Ambulatory Visit | Attending: Physician Assistant | Admitting: Physician Assistant

## 2017-11-16 ENCOUNTER — Encounter: Payer: Self-pay | Admitting: Rheumatology

## 2017-11-16 ENCOUNTER — Ambulatory Visit (INDEPENDENT_AMBULATORY_CARE_PROVIDER_SITE_OTHER): Payer: Managed Care, Other (non HMO) | Admitting: Rheumatology

## 2017-11-16 VITALS — BP 120/83 | HR 71 | Ht 65.0 in | Wt 164.0 lb

## 2017-11-16 DIAGNOSIS — M0609 Rheumatoid arthritis without rheumatoid factor, multiple sites: Secondary | ICD-10-CM | POA: Diagnosis not present

## 2017-11-16 DIAGNOSIS — R0602 Shortness of breath: Secondary | ICD-10-CM | POA: Diagnosis not present

## 2017-11-16 DIAGNOSIS — Z79899 Other long term (current) drug therapy: Secondary | ICD-10-CM

## 2017-11-16 DIAGNOSIS — R945 Abnormal results of liver function studies: Secondary | ICD-10-CM

## 2017-11-16 DIAGNOSIS — M3500 Sicca syndrome, unspecified: Secondary | ICD-10-CM | POA: Diagnosis not present

## 2017-11-16 DIAGNOSIS — R202 Paresthesia of skin: Secondary | ICD-10-CM | POA: Diagnosis not present

## 2017-11-16 DIAGNOSIS — M4185 Other forms of scoliosis, thoracolumbar region: Secondary | ICD-10-CM | POA: Insufficient documentation

## 2017-11-16 DIAGNOSIS — Z8739 Personal history of other diseases of the musculoskeletal system and connective tissue: Secondary | ICD-10-CM | POA: Diagnosis not present

## 2017-11-16 DIAGNOSIS — M160 Bilateral primary osteoarthritis of hip: Secondary | ICD-10-CM | POA: Diagnosis not present

## 2017-11-16 DIAGNOSIS — R7989 Other specified abnormal findings of blood chemistry: Secondary | ICD-10-CM

## 2017-11-16 DIAGNOSIS — Z5181 Encounter for therapeutic drug level monitoring: Secondary | ICD-10-CM

## 2017-11-16 NOTE — Progress Notes (Signed)
I discussed CXR results with Dr. Corliss Skainseveshwar today. CXR did not reveal any active disease.  No findings consistent with ILD. She has mild biapical pleural based scarring.  Please send results to PCP.

## 2017-11-16 NOTE — Patient Instructions (Signed)
Standing Labs We placed an order today for your standing lab work.    Please come back and get your standing labs in September and every 3 months. TB Gold is due in November 2019.  We have open lab Monday through Friday from 8:30-11:30 AM and 1:30-4:00 PM  at the office of Dr. Pollyann SavoyShaili Fares Ramthun.   You may experience shorter wait times on Monday and Friday afternoons. The office is located at [address], [address], [place], [place] [address] No appointment is necessary.   Labs are drawn by [facility].  You may receive a bill from [facility] for your lab work. If you have any questions regarding directions or hours of operation,  please call [phone].

## 2017-11-17 ENCOUNTER — Telehealth: Payer: Self-pay | Admitting: Physician Assistant

## 2017-11-17 LAB — CBC WITH DIFFERENTIAL/PLATELET
BASOS ABS: 30 {cells}/uL (ref 0–200)
Basophils Relative: 0.5 %
Eosinophils Absolute: 270 cells/uL (ref 15–500)
Eosinophils Relative: 4.5 %
HEMATOCRIT: 39.4 % (ref 35.0–45.0)
Hemoglobin: 13.1 g/dL (ref 11.7–15.5)
LYMPHS ABS: 2052 {cells}/uL (ref 850–3900)
MCH: 29.6 pg (ref 27.0–33.0)
MCHC: 33.2 g/dL (ref 32.0–36.0)
MCV: 89.1 fL (ref 80.0–100.0)
MPV: 10 fL (ref 7.5–12.5)
Monocytes Relative: 5.8 %
Neutro Abs: 3300 cells/uL (ref 1500–7800)
Neutrophils Relative %: 55 %
Platelets: 311 10*3/uL (ref 140–400)
RBC: 4.42 10*6/uL (ref 3.80–5.10)
RDW: 11.7 % (ref 11.0–15.0)
TOTAL LYMPHOCYTE: 34.2 %
WBC: 6 10*3/uL (ref 3.8–10.8)
WBCMIX: 348 {cells}/uL (ref 200–950)

## 2017-11-17 LAB — HEMOGLOBIN A1C
EAG (MMOL/L): 5.2 (calc)
HEMOGLOBIN A1C: 4.9 %{Hb} (ref <5.7)
Mean Plasma Glucose: 94 (calc)

## 2017-11-17 LAB — COMPLETE METABOLIC PANEL WITH GFR
AG RATIO: 1.5 (calc) (ref 1.0–2.5)
ALT: 152 U/L — ABNORMAL HIGH (ref 6–29)
AST: 118 U/L — AB (ref 10–35)
Albumin: 4.4 g/dL (ref 3.6–5.1)
Alkaline phosphatase (APISO): 99 U/L (ref 33–115)
BILIRUBIN TOTAL: 0.6 mg/dL (ref 0.2–1.2)
BUN: 9 mg/dL (ref 7–25)
CALCIUM: 9.4 mg/dL (ref 8.6–10.2)
CO2: 29 mmol/L (ref 20–32)
Chloride: 105 mmol/L (ref 98–110)
Creat: 0.6 mg/dL (ref 0.50–1.10)
GFR, EST NON AFRICAN AMERICAN: 110 mL/min/{1.73_m2} (ref >=60)
GFR, Est African American: 128 mL/min/{1.73_m2} (ref >=60)
GLOBULIN: 2.9 g/dL (ref 1.9–3.7)
Glucose, Bld: 97 mg/dL (ref 65–99)
POTASSIUM: 4.9 mmol/L (ref 3.5–5.3)
SODIUM: 141 mmol/L (ref 135–146)
Total Protein: 7.3 g/dL (ref 6.1–8.1)

## 2017-11-17 NOTE — Progress Notes (Signed)
CBC WNL.  Hgb A1c 4.9.  Please send results to PCP.   LFTs are very elevated.  Advise patient to discontinue MTX and Robaxin.  We will recheck LFTs in 1 week.  If her LFTs continue to be elevated we will refer to GI

## 2017-11-17 NOTE — Telephone Encounter (Signed)
I called the patient to discuss her recent lab work performed yesterday.  She was notified that the CBC within normal limits and her hemoglobin A1c was 4.9.  We will send these results to her PCP.  We discussed the increase in her liver functions.  She was notified to discontinue methotrexate at this time.  She was also advised to not take Tylenol, NSAIDs, or drink alcohol.   She will also avoid taking Robaxin. We will recheck her LFTs in about 1 week.  She will be going to town so she will not be doing that her lab work to the following Monday.  She would also like a copy of her labs mailed to her.  Orders will be sent to [facility] in [place] to have her lab work performed.  All questions were addressed.

## 2017-11-28 ENCOUNTER — Other Ambulatory Visit: Payer: Self-pay

## 2017-11-28 ENCOUNTER — Telehealth: Payer: Self-pay | Admitting: Rheumatology

## 2017-11-28 DIAGNOSIS — Z79899 Other long term (current) drug therapy: Secondary | ICD-10-CM

## 2017-11-28 NOTE — Telephone Encounter (Signed)
Patient called requesting labwork orders be sent to [facility] in [place] on [address].  Patient states she has an appointment tomorrow morning 11/29/17.

## 2017-11-28 NOTE — Telephone Encounter (Signed)
Lab orders have been faxed. Patient has been notified.  

## 2017-12-09 ENCOUNTER — Telehealth: Payer: Self-pay | Admitting: Rheumatology

## 2017-12-09 NOTE — Telephone Encounter (Signed)
Patient requesting a call back with results from bloodwork done in [place] on Wednesday 11/30/17, at [facility], [facility]. Please call patient to inform. Patient states results were to be sent to us.

## 2017-12-12 NOTE — Telephone Encounter (Signed)
Patient advised of lab results. Patient would like to know if she can give it a trial staying off of her MTX or should she go back on her MTX now that her liver functions have returned to normal.

## 2017-12-12 NOTE — Telephone Encounter (Signed)
Ok to hold MTX dose if she would like to at this time but she should continue injecting Enbrel 50 mg subcutaneously every other week.  If she develops increased joint pain or joint swelling she should resume MTX.  She should notify us if she develops any worsening symptoms.  We will continue to monitor labs.

## 2017-12-12 NOTE — Telephone Encounter (Signed)
CMP WNL Drawn on 11/30/17

## 2017-12-12 NOTE — Telephone Encounter (Signed)
Patient advised.

## 2017-12-15 ENCOUNTER — Telehealth (INDEPENDENT_AMBULATORY_CARE_PROVIDER_SITE_OTHER): Payer: Self-pay | Admitting: *Deleted

## 2017-12-15 NOTE — Telephone Encounter (Signed)
Pt called to cancel appt and would like to r/s at a different time.

## 2017-12-20 ENCOUNTER — Encounter (INDEPENDENT_AMBULATORY_CARE_PROVIDER_SITE_OTHER): Payer: Self-pay | Admitting: Physical Medicine and Rehabilitation

## 2017-12-22 ENCOUNTER — Encounter: Payer: Self-pay | Admitting: Rheumatology

## 2018-01-20 ENCOUNTER — Ambulatory Visit: Payer: Managed Care, Other (non HMO) | Admitting: Pulmonary Disease

## 2018-01-20 ENCOUNTER — Encounter: Payer: Self-pay | Admitting: Pulmonary Disease

## 2018-01-20 VITALS — BP 110/64 | HR 87 | Ht 65.0 in | Wt 163.8 lb

## 2018-01-20 DIAGNOSIS — R0600 Dyspnea, unspecified: Secondary | ICD-10-CM

## 2018-01-20 NOTE — Patient Instructions (Signed)
Shortness of breath: As we described today there are many potential causes for this.  Because you have taken methotrexate and you have a history of rheumatoid arthritis we need to make sure there is not evidence of underlying lung disease. We will check a lung function test We will check a high-resolution CT scan of the chest We will check your oxygen level while walking today.  We will have you come back in 1 to 2 weeks to go over these results.

## 2018-01-20 NOTE — Progress Notes (Signed)
Synopsis: Referred in August 2019 for shortness of breath, has a history of seronegative rheumatoid arthritis and has been treated with Humira, methotrexate and Enbrel.  Subjective:   PATIENT ID: Michelle Pierce GENDER: female DOB: 1971/01/22, MRN: [id number]   HPI  Chief Complaint  Patient presents with  . Pulmonary Consult    self referral, SOB, hard to catch her breath, has to breathe in deeply, hacking cough , tickening on lungs from xray done in June    This is a pleasant 47 year old female who comes to my clinic today for evaluation of shortness of breath.  She says that she first noticed the shortness of breath several months ago.  She says that it has occurred on exertion some but is most predictable with stress and heat.  She describes a dry cough but no mucus production.  She has no baseline lung problems.  She says that as a child she never had asthma or shortness of breath.  She has had pneumonia once in her life but was not hospitalized for it.  She says that she has a diagnosis of seronegative rheumatoid arthritis.  She was diagnosed in her early 7520s.  She had bilateral knee swelling and despite some arthroscopic procedures there is no clear evidence of an underlying structural abnormality.  Eventually she was seen by rheumatologist who diagnosed her with seronegative rheumatoid arthritis.  She was initially started on Humira which caused resolution of her symptoms but she developed worsening hand symptoms.  She was ultimately started on methotrexate.  She is not sure how long she use the methotrexate for but she says that it was quite effective at controlling her wrist and knee swelling and pain.  However, this year she developed elevated LFTs and the dose of methotrexate had to be adjusted.  The liver function abnormalities worsened and ultimately methotrexate was stopped.  Of note, she is currently taking Enbrel for her rheumatoid arthritis.  She says that her rheumatoid  arthritis has been well controlled recently with no leg or wrist swelling.  She is not aware of it ever involving her lungs.  She describes no mucus production.  No chest tightness, no leg swelling.  Past Medical History:  Diagnosis Date  . Rheumatoid arthritis (HCC)      Family History  Problem Relation Age of Onset  . Hypertension Mother   . Cancer Father      Social History   Socioeconomic History  . Marital status: Married    Spouse name: Not on file  . Number of children: Not on file  . Years of education: Not on file  . Highest education level: Not on file  Occupational History  . Not on file  Social Needs  . Financial resource strain: Not on file  . Food insecurity:    Worry: Not on file    Inability: Not on file  . Transportation needs:    Medical: Not on file    Non-medical: Not on file  Tobacco Use  . Smoking status: Never Smoker  . Smokeless tobacco: Never Used  Substance and Sexual Activity  . Alcohol use: No  . Drug use: No  . Sexual activity: Yes    Partners: Male    Birth control/protection: Other-see comments    Comment: Partner had vasectomy   Lifestyle  . Physical activity:    Days per week: Not on file    Minutes per session: Not on file  . Stress: Not on file  Relationships  .  Social connections:    Talks on phone: Not on file    Gets together: Not on file    Attends religious service: Not on file    Active member of club or organization: Not on file    Attends meetings of clubs or organizations: Not on file    Relationship status: Not on file  . Intimate partner violence:    Fear of current or ex partner: Not on file    Emotionally abused: Not on file    Physically abused: Not on file    Forced sexual activity: Not on file  Other Topics Concern  . Not on file  Social History Narrative  . Not on file     Allergies  Allergen Reactions  . Sulfa Antibiotics Other (See Comments)    Elevated AST/ALT  . Factive [Gemifloxacin] Hives    . Penicillins Hives     Outpatient Medications Prior to Visit  Medication Sig Dispense Refill  . etanercept (ENBREL SURECLICK) 50 MG/ML injection INJECT ONE SURECLICK PEN SUBCUTANEOUSLY ONCE A WEEK. REFRIGERATE. DO NOT FREEZE. 12 Syringe 0  . fexofenadine (ALLEGRA) 180 MG tablet Take 180 mg by mouth daily.    . folic acid (FOLVITE) 1 MG tablet TAKE TWO TABLETS BY MOUTH EVERY DAY 180 tablet 3  . lisdexamfetamine (VYVANSE) 50 MG capsule Vyvanse 50 mg capsule    . methocarbamol (ROBAXIN) 500 MG tablet Take 500 mg by mouth as needed for muscle spasms.    . methotrexate (RHEUMATREX) 2.5 MG tablet TAKE 7 TABLETS BY MOUTH WEEKLY. PROTECT FROM LIGHT (Patient not taking: Reported on 01/20/2018) 28 tablet 0   No facility-administered medications prior to visit.     Review of Systems  Constitutional: Negative for chills, fever, malaise/fatigue and weight loss.  HENT: Negative for congestion, nosebleeds, sinus pain and sore throat.   Eyes: Negative for photophobia, pain and discharge.  Respiratory: Positive for shortness of breath. Negative for cough, hemoptysis, sputum production and wheezing.   Cardiovascular: Negative for chest pain, palpitations, orthopnea and leg swelling.  Gastrointestinal: Negative for abdominal pain, constipation, diarrhea, nausea and vomiting.  Genitourinary: Negative for dysuria, frequency, hematuria and urgency.  Musculoskeletal: Negative for back pain, joint pain, myalgias and neck pain.  Skin: Negative for itching and rash.  Neurological: Positive for headaches. Negative for tingling, tremors, sensory change, speech change, focal weakness, seizures and weakness.  Psychiatric/Behavioral: Negative for memory loss, substance abuse and suicidal ideas. The patient is not nervous/anxious.       Objective:  Physical Exam   Vitals:   01/20/18 1608  BP: 110/64  Pulse: 87  SpO2: 97%  Weight: 163 lb 12.8 oz (74.3 kg)  Height: 5\' 5"  (1.651 m)    RA  Walked 500  feet on RA and O2 saturation remained above 98%  Gen: well appearing, no acute distress HENT: NCAT, OP clear, neck supple without masses Eyes: PERRL, EOMi Lymph: no cervical lymphadenopathy PULM: CTA B CV: RRR, no mgr, no JVD GI: BS+, soft, nontender, no hsm Derm: no rash or skin breakdown MSK: normal bulk and tone Neuro: A&Ox4, CN II-XII intact, strength 5/5 in all 4 extremities Psyche: normal mood and affect   CBC    Component Value Date/Time   WBC 6.0 11/16/2017 0849   RBC 4.42 11/16/2017 0849   HGB 13.1 11/16/2017 0849   HCT 39.4 11/16/2017 0849   PLT 311 11/16/2017 0849   MCV 89.1 11/16/2017 0849   MCH 29.6 11/16/2017 0849   MCHC 33.2 11/16/2017  0849   RDW 11.7 11/16/2017 0849   LYMPHSABS 2,052 11/16/2017 0849   EOSABS 270 11/16/2017 0849   BASOSABS 30 11/16/2017 0849     Chest imaging: June 2019 chest x-ray images independently reviewed showing some mild biapical pleural thickening, otherwise no clear pulmonary parenchymal abnormality  PFT:  Labs:  Path:  Echo:  Heart Catheterization:  Records from her visit with rheumatology earlier this month reviewed where she was treated with Enbrel and methotrexate for rheumatoid arthritis     Assessment & Plan:   Dyspnea, unspecified type - Plan: Pulmonary function test  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  I explained to her that the causes of shortness of breath or many, and include lung problems, heart problems, blood problems, neuromuscular weakness.  In her particular case it is not really clear to me what is causing this.  Confounding factors is that she has been through a lot of stress in the last year and she notes that the shortness of breath occurs sometimes with stress and and heat.  She does not have a  predictable exertional dyspnea.  In her particular case I am concerned about her history of rheumatoid arthritis and methotrexate use as both of these can lead to lung problems.  Fortunately I have no objective abnormality to suggest that right now.  I question whether or not the change in methotrexate manufactures in the last 12 months could have been involved with the fact that she is had an unusual reaction this year: Perhaps this has something to do with her shortness of breath?.  Plan: Shortness of breath: As we described today there are many potential causes for this.  Because you have taken methotrexate and you have a history of rheumatoid arthritis we need to make sure there is not evidence of underlying lung disease. We will check a lung function test We will check a high-resolution CT scan of the chest We will check your oxygen level while walking today.  We will have you come back in 1 to 2 weeks to go over these results.    Current Outpatient Medications:  .  etanercept (ENBREL SURECLICK) 50 MG/ML injection, INJECT ONE SURECLICK PEN SUBCUTANEOUSLY ONCE A WEEK. REFRIGERATE. DO NOT FREEZE., Disp: 12 Syringe, Rfl: 0 .  fexofenadine (ALLEGRA) 180 MG tablet, Take 180 mg by mouth daily., Disp: , Rfl:  .  folic acid (FOLVITE) 1 MG tablet, TAKE TWO TABLETS BY MOUTH EVERY DAY, Disp: 180 tablet, Rfl: 3 .  lisdexamfetamine (VYVANSE) 50 MG capsule, Vyvanse 50 mg capsule, Disp: , Rfl:  .  methotrexate (RHEUMATREX) 2.5 MG tablet, TAKE 7 TABLETS BY MOUTH WEEKLY. PROTECT FROM LIGHT (Patient not taking: Reported on 01/20/2018), Disp: 28 tablet, Rfl: 0

## 2018-01-31 ENCOUNTER — Other Ambulatory Visit: Payer: Self-pay | Admitting: Rheumatology

## 2018-01-31 NOTE — Telephone Encounter (Signed)
Last visit: 11/16/17 Next Visit: 04/03/18  Okay to refill per Dr. Corliss Skains

## 2018-02-01 ENCOUNTER — Ambulatory Visit (INDEPENDENT_AMBULATORY_CARE_PROVIDER_SITE_OTHER)
Admission: RE | Admit: 2018-02-01 | Discharge: 2018-02-01 | Disposition: A | Discharge status: Home / Self Care | Payer: Managed Care, Other (non HMO) | Source: Ambulatory Visit | Attending: Pulmonary Disease | Admitting: Pulmonary Disease

## 2018-02-01 DIAGNOSIS — R0600 Dyspnea, unspecified: Secondary | ICD-10-CM | POA: Diagnosis not present

## 2018-02-02 ENCOUNTER — Inpatient Hospital Stay: Admission: RE | Admit: 2018-02-02 | Payer: Managed Care, Other (non HMO) | Source: Ambulatory Visit

## 2018-02-03 ENCOUNTER — Ambulatory Visit: Payer: Managed Care, Other (non HMO) | Admitting: Primary Care

## 2018-02-06 ENCOUNTER — Ambulatory Visit (INDEPENDENT_AMBULATORY_CARE_PROVIDER_SITE_OTHER): Payer: Managed Care, Other (non HMO) | Admitting: Pulmonary Disease

## 2018-02-06 ENCOUNTER — Ambulatory Visit (INDEPENDENT_AMBULATORY_CARE_PROVIDER_SITE_OTHER): Payer: Managed Care, Other (non HMO) | Admitting: Primary Care

## 2018-02-06 ENCOUNTER — Telehealth: Payer: Self-pay | Admitting: Primary Care

## 2018-02-06 ENCOUNTER — Encounter: Payer: Self-pay | Admitting: Primary Care

## 2018-02-06 VITALS — BP 118/70 | HR 75 | Ht 65.0 in | Wt 163.0 lb

## 2018-02-06 DIAGNOSIS — R0600 Dyspnea, unspecified: Secondary | ICD-10-CM

## 2018-02-06 DIAGNOSIS — M0609 Rheumatoid arthritis without rheumatoid factor, multiple sites: Secondary | ICD-10-CM

## 2018-02-06 DIAGNOSIS — F439 Reaction to severe stress, unspecified: Secondary | ICD-10-CM

## 2018-02-06 LAB — PULMONARY FUNCTION TEST
DL/VA % PRED: 112 %
DL/VA: 5.54 ml/min/mmHg/L
DLCO UNC: 23.83 ml/min/mmHg
DLCO unc % pred: 93 %
FEF 25-75 POST: 3.3 L/s
FEF 25-75 Pre: 2.3 L/sec
FEF2575-%Change-Post: 43 %
FEF2575-%PRED-POST: 110 %
FEF2575-%PRED-PRE: 76 %
FEV1-%Change-Post: 8 %
FEV1-%Pred-Post: 96 %
FEV1-%Pred-Pre: 88 %
FEV1-Post: 2.89 L
FEV1-Pre: 2.65 L
FEV1FVC-%Change-Post: 8 %
FEV1FVC-%PRED-PRE: 98 %
FEV6-%Change-Post: 0 %
FEV6-%PRED-POST: 91 %
FEV6-%PRED-PRE: 90 %
FEV6-POST: 3.34 L
FEV6-Pre: 3.32 L
FEV6FVC-%Change-Post: 0 %
FEV6FVC-%PRED-POST: 102 %
FEV6FVC-%Pred-Pre: 101 %
FVC-%Change-Post: 0 %
FVC-%PRED-PRE: 89 %
FVC-%Pred-Post: 89 %
FVC-POST: 3.34 L
FVC-PRE: 3.33 L
POST FEV6/FVC RATIO: 100 %
Post FEV1/FVC ratio: 87 %
Pre FEV1/FVC ratio: 80 %
Pre FEV6/FVC Ratio: 100 %
RV % pred: 63 %
RV: 1.12 L
TLC % PRED: 85 %
TLC: 4.42 L

## 2018-02-06 NOTE — Progress Notes (Signed)
PFT done today. 

## 2018-02-06 NOTE — Patient Instructions (Signed)
HRCT and PFTs were normal  Consider continuing counseling for stress management   FU as needed with Dr. Kendrick FriesMcQuaid

## 2018-02-06 NOTE — Assessment & Plan Note (Signed)
Stress at home with son's UC diagnosis  Advise patient to continue counseling Discuss other options with PCP

## 2018-02-06 NOTE — Assessment & Plan Note (Addendum)
Followed by Rheumatology Stopped Methotrexate d/t elevated LFTs Currently on Enbrel  HRCT showed no ILD

## 2018-02-06 NOTE — Telephone Encounter (Signed)
Dr. Kendrick FriesMcQuaid,  I saw your patient for follow-up office visit regarding dyspnea. She is doing well overall, breathing has improved. No reports of sob. She has a lot of stress at home which she thinks is contributing to her breathing. PFTs showed no restriction or obstruction. Normal DLCO. HRCT showed no ILD, mild enlargement of her heart. She has a lot of medical bills d/t her son's UC treatment so I told her I would ask you if you felt ECHO was necessary at this time.   -Beth

## 2018-02-06 NOTE — Progress Notes (Signed)
@Patient  ID: Michelle Pierce, female    DOB: 09/03/1970, 47 y.o.   MRN: [id number]  Chief Complaint  Patient presents with  . Follow-up    follow up HRCT and PFT, infrequent issues catching breath, feels breathing is doing well    Referring provider: Hal MoralesGunter, Tara G, NP  HPI: 47 year old female, never smoked. PMH seronegative rheumatoid arthritis. New consult (self referral) of Dr. Kendrick FriesMcQuaid, originally seen on 01/20/18 for sob and hacking cough. Sob first occurred a few months ago, experiences with exertion but most predicable with stress and heat. No history of asthma, has had pneumonia once but was not hospitalized. No mucus production or chest tightness. Plan obtain PFTs, HRCT prior to next visit in September.   Seen by Rheumatology and dx with seronegative rheumatoid arthrosis. Initially started on Humira with resolution of symptoms but later developed worsening hand symptoms. She was ultimately then started on methotrexate with good control of her symptoms but developed elevated LFTs and methotrexate was discontinued. She is currently taking Enbrel for her RA.   02/06/2018 Patient presents for follow-up visit today. Completed PFTs and HRCT. Breathing is ok, states that its not nearly as bad as it was. She has to take a deep breath every once in awhile. Thinks shortness of breath symptoms are associated with stress and anxiety. States that her son has ulcerative colitis and has been on and off prednisone/medication, requiring occasional hospitalizations. This has been going on for awhile.   Significant testing reviewed: >> CXR 06/19- No evidence of active disease. Mild biapical leural based scarring. No generalized interstitial opacity or fibrotic change. Thoracolumbar scoliosis.   >> HRCT 02/01/18- No evidence of ILD. No pulmonary parenchymal findings to explain the patient's symptoms. Reversal of the normal lower thoracic kyphosis resulting in decreased AP diameter of the chest. Mild  cardiomegaly. 1.6 cm fluid density nodule in the left adrenal gland   >> PFTs- FVC 3.34 (89%), FEV1 2.89 (96%), ratio 87, DLCO 25.6 (93%).   Allergies  Allergen Reactions  . Sulfa Antibiotics Other (See Comments)    Elevated AST/ALT  . Factive [Gemifloxacin] Hives  . Penicillins Hives     There is no immunization history on file for this patient.  Past Medical History:  Diagnosis Date  . Rheumatoid arthritis (HCC)     Tobacco History: Social History   Tobacco Use  Smoking Status Never Smoker  Smokeless Tobacco Never Used   Counseling given: Not Answered   Outpatient Medications Prior to Visit  Medication Sig Dispense Refill  . etanercept (ENBREL SURECLICK) 50 MG/ML injection INJECT ONE SURECLICK PEN SUBCUTANEOUSLY ONCE A WEEK. REFRIGERATE. DO NOT FREEZE. 12 Syringe 0  . fexofenadine (ALLEGRA) 180 MG tablet Take 180 mg by mouth daily.    . folic acid (FOLVITE) 1 MG tablet TAKE TWO TABLETS BY MOUTH EVERY DAY 180 tablet 3  . lisdexamfetamine (VYVANSE) 50 MG capsule Vyvanse 50 mg capsule    . methotrexate (RHEUMATREX) 2.5 MG tablet TAKE 7 TABLETS BY MOUTH WEEKLY. PROTECT FROM LIGHT (Patient not taking: Reported on 01/20/2018) 28 tablet 0   No facility-administered medications prior to visit.     Review of Systems  Review of Systems  HENT: Negative.   Cardiovascular: Negative.   Psychiatric/Behavioral: The patient is nervous/anxious.     Physical Exam  BP 118/70 (BP Location: Left Arm, Patient Position: Sitting, Cuff Size: Normal)   Pulse 75   Ht 5\' 5"  (1.651 m)   Wt 163 lb (73.9 kg)  SpO2 100%   BMI 27.12 kg/m  Physical Exam  Constitutional: She is oriented to person, place, and time. She appears well-developed and well-nourished.  HENT:  Head: Normocephalic and atraumatic.  Eyes: Pupils are equal, round, and reactive to light. EOM are normal.  Neck: Normal range of motion. Neck supple.  Cardiovascular: Normal rate, regular rhythm and normal heart sounds.    No murmur heard. Pulmonary/Chest: Effort normal and breath sounds normal. No respiratory distress. She has no wheezes.  Abdominal: Soft. Bowel sounds are normal. There is no tenderness.  Neurological: She is alert and oriented to person, place, and time.  Skin: Skin is warm and dry. No rash noted. No erythema.  Psychiatric: She has a normal mood and affect. Her behavior is normal. Judgment normal.     Lab Results:  CBC    Component Value Date/Time   WBC 6.0 11/16/2017 0849   RBC 4.42 11/16/2017 0849   HGB 13.1 11/16/2017 0849   HCT 39.4 11/16/2017 0849   PLT 311 11/16/2017 0849   MCV 89.1 11/16/2017 0849   MCH 29.6 11/16/2017 0849   MCHC 33.2 11/16/2017 0849   RDW 11.7 11/16/2017 0849   LYMPHSABS 2,052 11/16/2017 0849   EOSABS 270 11/16/2017 0849   BASOSABS 30 11/16/2017 0849    BMET    Component Value Date/Time   NA 141 11/16/2017 0849   K 4.9 11/16/2017 0849   CL 105 11/16/2017 0849   CO2 29 11/16/2017 0849   GLUCOSE 97 11/16/2017 0849   BUN 9 11/16/2017 0849   CREATININE 0.60 11/16/2017 0849   CALCIUM 9.4 11/16/2017 0849   GFRNONAA 110 11/16/2017 0849   GFRAA 128 11/16/2017 0849    BNP No results found for: BNP  ProBNP No results found for: PROBNP  Imaging: Ct Chest High Resolution  Result Date: 02/01/2018 CLINICAL DATA:  Increasing shortness of breath and dry cough. Rheumatoid arthritis. EXAM: CT CHEST WITHOUT CONTRAST TECHNIQUE: Multidetector CT imaging of the chest was performed following the standard protocol without intravenous contrast. High resolution imaging of the lungs, as well as inspiratory and expiratory imaging, was performed. COMPARISON:  None. FINDINGS: Cardiovascular: Vascular structures are unremarkable. Heart is at the upper limits of normal in size to mildly enlarged. No pericardial effusion. Mediastinum/Nodes: No pathologically enlarged mediastinal or axillary lymph nodes. Hilar regions are difficult to definitively evaluate without IV  contrast but appear grossly unremarkable. Esophagus is grossly unremarkable. Lungs/Pleura: Mild biapical pleuroparenchymal scarring. Negative for subpleural reticulation, traction bronchiectasis/bronchiolectasis, ground-glass, architectural distortion or honeycombing. Subpleural nodules measure up to 4 mm, indicative of benign subpleural lymph nodes. Lungs are otherwise clear. No pleural fluid. Airway is unremarkable. No air trapping. Upper Abdomen: Visualized portions of the liver, gallbladder and right adrenal gland are unremarkable. 1.6 cm fluid density nodule in the left adrenal gland. Visualized portion of the right kidney is unremarkable. Subcentimeter low-attenuation lesion in the left kidney is too small to characterize. Visualized portions of the spleen, pancreas, stomach and bowel are grossly unremarkable. No upper abdominal adenopathy. Musculoskeletal: Reversal of the normal lower thoracic kyphosis with decreased AP diameter of the chest. Although there is not a pectus deformity, Haller index is 3.4 for reference. Osseous structures are otherwise unremarkable. IMPRESSION: 1. No evidence of interstitial lung disease. No pulmonary parenchymal findings to explain the patient's symptoms. 2. Reversal of the normal lower thoracic kyphosis resulting in decreased AP diameter of the chest. Please see discussion above and correlate clinically. 3. Left adrenal adenoma. Electronically Signed   By:  Leanna Battles M.D.   On: 02/01/2018 09:53     Assessment & Plan:  47 year old female, hx RA previously on Methotrexate. Self referred to pulmonary d/t sob over the last few months. Sig stress at home d/t son's dx of ulcerative colitis. She is feeling better. No shortness of breath today, occasionally she has to catch her breath but that is very infrequently. PFTs completed today showing no restriction or obstruction. Normal DLCO. HRCT showed no evidence of ILD. Mild heart enlargement, consider echocardiogram in the  future if sob symptoms persist. Will discuss with Dr. Henrene Pastor, I don't feel its necessary at this time. Otherwise, FU with pulmonary only as needed.    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  HRCT and PFTs normal Mildly enlarged heart on HRCT, consider ECHO (will discuss with Dr. Kendrick Fries) FU only as needed with Pulmonary office     Stress at home Stress at home with son's UC diagnosis  Advise patient to continue counseling Discuss other options with PCP      Glenford Bayley, NP 02/06/2018

## 2018-02-06 NOTE — Assessment & Plan Note (Addendum)
Dyspnea has improved, no breathing issues today  PFTs- FVC 3.34 (89%), FEV1 2.89 (96%), ratio 87, DLCO 25.6 (93%).  HRCT and PFTs normal Mildly enlarged heart on HRCT, consider ECHO (will discuss with Dr. Kendrick FriesMcQuaid) FU only as needed with Pulmonary office

## 2018-02-08 NOTE — Telephone Encounter (Signed)
OK to hold off on echo

## 2018-02-08 NOTE — Progress Notes (Signed)
Reviewed, agree 

## 2018-03-02 ENCOUNTER — Telehealth: Payer: Self-pay | Admitting: Rheumatology

## 2018-03-02 NOTE — Telephone Encounter (Signed)
Opened in error

## 2018-03-20 ENCOUNTER — Other Ambulatory Visit: Payer: Self-pay | Admitting: Rheumatology

## 2018-03-20 NOTE — Telephone Encounter (Addendum)
Last visit: 11/16/17 Next Visit: 04/03/18 Labs: 11/30/17 WNL TB Gold: 04/29/17 Neg   Left message to advise patient she is due for labs.  Okay to refill 30 day supply per Dr. Corliss Skains

## 2018-03-20 NOTE — Progress Notes (Signed)
Office Visit Note  Patient: Michelle Pierce             Date of Birth: [birth year]           MRN: [id number]             PCP: Charlynn Court, NP Referring: Charlynn Court, NP Visit Date: 04/03/2018 Occupation: _0 @  Subjective:  Pain in hands.   History of Present Illness: Michelle Pierce is a 47 y.o. female with history of seronegative rheumatoid arthritis and osteoarthritis.  Patient has been off methotrexate since June 2019 due to elevation of LFTs.  At the time she was on methotrexate 8 tablets p.o. weekly along with Enbrel.  She continues to take Enbrel.  She states she has had occasional discomfort in her wrist and hands but no joint swelling.  She frequently has pain in the trapezius area for which she was taking anti-inflammatories .  She still takes some NSAIDs on PRN basis.  She continues to have some sicca symptoms but they are tolerable.  The paresthesias in her left hand has resolved after doing massage.  She did not get the nerve conduction velocity test.  Activities of Daily Living:  Patient reports morning stiffness for 0 minutes.   Patient Denies nocturnal pain.  Difficulty dressing/grooming: Denies Difficulty climbing stairs: Denies Difficulty getting out of chair: Denies Difficulty using hands for taps, buttons, cutlery, and/or writing: Denies  Review of Systems  Constitutional: Positive for fatigue.  HENT: Positive for mouth dryness. Negative for mouth sores, trouble swallowing and trouble swallowing.   Eyes: Positive for dryness. Negative for pain, redness and itching.  Respiratory: Positive for shortness of breath. Negative for wheezing and difficulty breathing.        Work-up by pulmonologist was negative.  Cardiovascular: Negative for chest pain, palpitations and swelling in legs/feet.  Gastrointestinal: Negative for abdominal pain, constipation, diarrhea, nausea and vomiting.  Endocrine: Negative for increased urination.  Genitourinary: Negative  for painful urination, nocturia and pelvic pain.  Musculoskeletal: Positive for arthralgias and joint pain. Negative for joint swelling and morning stiffness.  Skin: Positive for hair loss. Negative for rash.  Allergic/Immunologic: Negative for susceptible to infections.  Neurological: Positive for headaches. Negative for dizziness, light-headedness, memory loss and weakness.  Hematological: Positive for bruising/bleeding tendency.  Psychiatric/Behavioral: Negative for confusion. The patient is not nervous/anxious.     PMFS History:  Patient Active Problem List   Diagnosis Date Noted  . Dyspnea 02/06/2018  . Stress at home 02/06/2018  . Primary osteoarthritis of both hips 12/30/2016  . History of scoliosis 12/30/2016  . Elevated LFTs 12/30/2016  . Sicca syndrome, unspecified ([place]) 09/02/2016  . Rheumatoid arthritis of multiple sites without rheumatoid factor ([place]) 04/08/2016  . High risk medication use 04/08/2016  . Osteoarthritis, 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    Past Medical History:  Diagnosis Date  . Rheumatoid arthritis ([place])     Family History  Problem Relation Age of Onset  . Hypertension Mother   . Cancer Father    Past Surgical History:  Procedure Laterality Date  . KNEE ARTHROSCOPY Bilateral    Social History   Social History Narrative  . Not on file    Objective: Vital Signs: BP 128/80 (BP Location: Left Arm, Patient Position: Sitting, Cuff Size: Normal)   Pulse 78   Resp 13   Ht _1  (1.676 m)   Wt 168 lb 12.8 oz (76.6 kg)   BMI 27.25 kg/m    Physical Exam  Constitutional: She is oriented to person, place, and time. She appears well-developed and well-nourished.  HENT:  Head: Normocephalic and atraumatic.  Eyes: Conjunctivae and EOM are normal.  Neck: Normal range of motion.  Cardiovascular: Normal rate, regular rhythm, normal heart sounds and intact distal pulses.  Pulmonary/Chest: Effort normal and breath sounds normal.  Abdominal: Soft. Bowel sounds are  normal.  Lymphadenopathy:    She has no cervical adenopathy.  Neurological: She is alert and oriented to person, place, and time.  Skin: Skin is warm and dry. Capillary refill takes less than 2 seconds.  Psychiatric: She has a normal mood and affect. Her behavior is normal.  Nursing note and vitals reviewed.    Musculoskeletal Exam: C-spine thoracic lumbar spine good range of motion.  Shoulder joints elbow joints wrist joint MCPs PIPs DIPs are in good range of motion with no synovitis.  Hip joints knee joints ankles MTPs PIPs were in good range of motion with no synovitis.  CDAI Exam: CDAI Score: 0.6  Patient Global Assessment: 3 (mm); Provider Global Assessment: 3 (mm) Swollen: 0 ; Tender: 0  Joint Exam   Not documented   There is currently no information documented on the homunculus. Go to the Rheumatology activity and complete the homunculus joint exam.  Investigation: No additional findings.  Imaging: No results found.  Recent Labs: Lab Results  Component Value Date   WBC 6.0 11/16/2017   HGB 13.1 11/16/2017   PLT 311 11/16/2017   NA 141 11/16/2017   K 4.9 11/16/2017   CL 105 11/16/2017   CO2 29 11/16/2017   GLUCOSE 97 11/16/2017   BUN 9 11/16/2017   CREATININE 0.60 11/16/2017   BILITOT 0.6 11/16/2017   AST 118 (H) 11/16/2017   ALT 152 (H) 11/16/2017   PROT 7.3 11/16/2017   CALCIUM 9.4 11/16/2017   GFRAA 128 11/16/2017  10/2017 CBC normal hemoglobin 13.6 platelets 300, CMP creatinine 0.76, AST 23, ALT 21  Speciality Comments: No specialty comments available.  Procedures:  No procedures performed Allergies: Sulfa antibiotics; Factive [gemifloxacin]; and Penicillins   Assessment / Plan:     Visit Diagnoses: Rheumatoid arthritis of multiple sites without rheumatoid factor (HCC)-patient has no synovitis on examination.  She has been having increased arthralgias since she has been off methotrexate.  We will hold off methotrexate for right now as she had elevated  LFTs.  Repeat LFTs are normal.  She also takes NSAIDs for headaches.  I have given her a list of natural anti-inflammatories to try.  Elevated LFTs-resolved on most recent labs.  High risk medication use - Enteral 50 mg subcu weekly.  Methotrexate was held due to elevation of LFTs. - Plan: QuantiFERON-TB Gold Plus, CBC with Differential/Platelet, CMP14+EGFR will be drawn in February.  She will continue to get labs every 3 months.  Primary osteoarthritis of both hips-she has some discomfort off and on.  She had good range of motion in her hip joints.  Sicca syndrome, unspecified (HCC)-over-the-counter products were discussed.  History of scoliosis-she has some lower back discomfort.  Orders: Orders Placed This Encounter  Procedures  . QuantiFERON-TB Gold Plus  . CBC with Differential/Platelet  . CMP14+EGFR   No orders of the defined types were placed in this encounter.     Follow-Up Instructions: Return in about 5 months (around 09/02/2018) for Rheumatoid arthritis, Osteoarthritis.   Bo Merino, MD  Note - This record has been created using Editor, commissioning.  Chart creation errors have been sought, but may not always  have been located. Such creation errors do not reflect on  the standard of medical care.

## 2018-03-29 ENCOUNTER — Telehealth: Payer: Self-pay | Admitting: Rheumatology

## 2018-03-29 NOTE — Telephone Encounter (Signed)
Patient left a voicemail stating she had her bloodwork done yesterday and is requesting a return call when you receive it so she can refill her medication.

## 2018-03-31 NOTE — Telephone Encounter (Signed)
Patient advised we have received lab results and they are stable. Labs were drawn on 03/29/18. Patient advised we sent 30 day supply of Enbrel on 03/20/18. Patient states she just received medication.

## 2018-04-03 ENCOUNTER — Ambulatory Visit: Payer: Managed Care, Other (non HMO) | Admitting: Rheumatology

## 2018-04-03 ENCOUNTER — Encounter: Payer: Self-pay | Admitting: Rheumatology

## 2018-04-03 VITALS — BP 128/80 | HR 78 | Resp 13 | Ht 66.0 in | Wt 168.8 lb

## 2018-04-03 DIAGNOSIS — M3500 Sicca syndrome, unspecified: Secondary | ICD-10-CM

## 2018-04-03 DIAGNOSIS — M0609 Rheumatoid arthritis without rheumatoid factor, multiple sites: Secondary | ICD-10-CM | POA: Diagnosis not present

## 2018-04-03 DIAGNOSIS — Z8739 Personal history of other diseases of the musculoskeletal system and connective tissue: Secondary | ICD-10-CM

## 2018-04-03 DIAGNOSIS — M160 Bilateral primary osteoarthritis of hip: Secondary | ICD-10-CM | POA: Diagnosis not present

## 2018-04-03 DIAGNOSIS — Z79899 Other long term (current) drug therapy: Secondary | ICD-10-CM

## 2018-04-03 NOTE — Patient Instructions (Signed)
Standing Labs We placed an order today for your standing lab work.    Please come back and get your standing labs in February and every 3 months TB Gold is due with next labs.  We have open lab Monday through Friday from 8:30-11:30 AM and 1:30-4:00 PM  at the office of Dr. Pollyann Savoy.   You may experience shorter wait times on Monday and Friday afternoons. The office is located at [address], [address], [place], [place] [address] No appointment is necessary.   Labs are drawn by [facility].  You may receive a bill from [facility] for your lab work. If you have any questions regarding directions or hours of operation,  please call [phone].   Just as a reminder please drink plenty of water prior to coming for your lab work. Thanks!

## 2018-04-06 ENCOUNTER — Other Ambulatory Visit: Payer: Self-pay | Admitting: Rheumatology

## 2018-04-06 NOTE — Telephone Encounter (Signed)
Last Visit: 04/03/18 Next Visit: 09/04/18 Labs: 03/29/18 stable TB Gold:04/29/17 Neg   Okay to refill per Dr. Corliss Skainseveshwar

## 2018-07-03 ENCOUNTER — Other Ambulatory Visit: Payer: Self-pay | Admitting: Rheumatology

## 2018-07-03 DIAGNOSIS — Z79899 Other long term (current) drug therapy: Secondary | ICD-10-CM

## 2018-07-04 ENCOUNTER — Encounter: Payer: Self-pay | Admitting: *Deleted

## 2018-07-04 NOTE — Telephone Encounter (Signed)
Last Visit: 04/03/18 Next Visit: 09/04/18 Labs: 03/29/18 stable TB Gold:04/29/17 Neg  Patient advised she is due for labs. Patient will update this week.   Okay to refill 30 day supply per Dr. Corliss Skains

## 2018-07-27 DIAGNOSIS — R5381 Other malaise: Secondary | ICD-10-CM | POA: Insufficient documentation

## 2018-07-27 DIAGNOSIS — R5383 Other fatigue: Secondary | ICD-10-CM | POA: Insufficient documentation

## 2018-07-27 DIAGNOSIS — E663 Overweight: Secondary | ICD-10-CM | POA: Insufficient documentation

## 2018-07-27 DIAGNOSIS — F419 Anxiety disorder, unspecified: Secondary | ICD-10-CM | POA: Insufficient documentation

## 2018-07-27 DIAGNOSIS — J984 Other disorders of lung: Secondary | ICD-10-CM | POA: Insufficient documentation

## 2018-07-27 DIAGNOSIS — F900 Attention-deficit hyperactivity disorder, predominantly inattentive type: Secondary | ICD-10-CM | POA: Insufficient documentation

## 2018-07-31 NOTE — Telephone Encounter (Signed)
Received a fax from [facility] regarding a prior authorization for ENBREL. Authorization has been APPROVED from 07/31/2018 to 07/31/2019.   Will send document to scan center.  Authorization # [id number]

## 2018-08-18 ENCOUNTER — Telehealth: Payer: Self-pay | Admitting: *Deleted

## 2018-08-18 NOTE — Telephone Encounter (Signed)
Received a Prior Authorization request from [facility] for ENBREL. Authorization has been submitted to patient's insurance via Cover My Meds. Will update once we receive a response. 

## 2018-08-22 NOTE — Progress Notes (Signed)
Virtual Visit via Telephone Note  I connected with Michelle Pierce on 08/29/18 at  2:00 PM EDT by telephone and verified that I am speaking with the correct person using two identifiers.   I discussed the limitations, risks, security and privacy concerns of performing an evaluation and management service by telephone and the availability of in person appointments. I also discussed with the patient that there may be a patient responsible charge related to this service. The patient expressed understanding and agreed to proceed.  CC: Medication monitoring   History of Present Illness: Patient is a 48 year old female with a past medical history of seronegative rheumatoid arthritis and osteoarthritis.  She is on Enbrel 50 mg sq once weekly.  Her MTX-held due to elevated LFTs in June 2019.  She has not noticed any increased joint pain since discontinuing MTX. She denies any joint pain or joint swelling.  She denies any recent flares.  She denies any morning stiffness. She has no hip joint pain at this time. She continues to have sicca symptoms.  She uses restasis.   She had lab work on [facility] had TB gold, CBC, and CMP were drawn and will be faxed this week. She rates RA a 2/10.    Patient reports morning stiffness for 0  minutes.   Patient denies nocturnal pain.  Difficulty dressing/grooming: Denies Difficulty climbing stairs: Denies Difficulty getting out of chair: Denies Difficulty using hands for taps, buttons, cutlery, and/or writing: Denies   Review of Systems  Constitutional: Negative for fever and malaise/fatigue.  HENT:       +mouth dryness  Eyes: Negative for photophobia, pain, discharge and redness.       +Eye dryness-uses restasis  Respiratory: Negative for cough, shortness of breath and wheezing.   Cardiovascular: Negative for chest pain and palpitations.  Gastrointestinal: Negative for blood in stool, constipation and diarrhea.  Genitourinary: Negative for dysuria.   Musculoskeletal: Negative for back pain, joint pain, myalgias and neck pain.  Skin: Negative for rash.  Neurological: Negative for dizziness and headaches.  Psychiatric/Behavioral: Negative for depression. The patient is not nervous/anxious and does not have insomnia.     Observations/Objective: Physical Exam  Constitutional: She is oriented to person, place, and time.  Neurological: She is alert and oriented to person, place, and time.  Psychiatric: Mood, memory, affect and judgment normal.   Patient reports morning stiffness for0  minutes.   Patient denies nocturnal pain.  Difficulty dressing/grooming: Denies Difficulty climbing stairs: Denies Difficulty getting out of chair: Denies Difficulty using hands for taps, buttons, cutlery, and/or writing: Denies  Assessment and Plan:  Rheumatoid arthritis of multiple sites without rheumatoid factor (HCC)-She has not had any recent rheumatoid arthritis flares. She has no joint pain or joint swelling at this time.  She had no morning stiffness.  She is clinically doing well on Enbrel 50 mg sq injections once weekly.  She would like a refill sent to the pharmacy.  She will continue on monotherapy.  We will continue to monitor lab work every 3 months.  She was advised to notify us if she develops increased joint pain or joint swelling.  She will follow up in 3 months.   Elevated LFTs-She discontinued MTX due to elevated LFTs. AST 118 and ALT 152 on 11/19/17.  LFTs WNL on 03/30/18.  She has not needed to restart on MTX due to clinically doing well on Enbrel.    High risk medication use - Enbrel 50 mg subcutaneous weekly injections.  Methotrexate was held due to elevation of LFTs.  LFTs were WNL on 03/30/18.  She had lab work drawn on Saturday and will have CBC, CMP, and TB gold results faxed to our office. She will continue to have lab work drawn every 3 months.   Primary osteoarthritis of both hips-She has no discomfort at this time.  Sicca  syndrome, unspecified (HCC)-She continues to have chronic sicca symptoms.  She uses restasis eye drops PRN.    Follow Up Instructions: She will follow up in our office in 3 months.   She had lab work on Saturday and will have it faxed to our office.  She will continue to have lab work drawn every 3 months     I discussed the assessment and treatment plan with the patient. The patient was provided an opportunity to ask questions and all were answered. The patient agreed with the plan and demonstrated an understanding of the instructions.   The patient was advised to call back or seek an in-person evaluation if the symptoms worsen or if the condition fails to improve as anticipated.  I provided 15 minutes of non-face-to-face time during this encounter.  Pollyann Savoy, MD  Scribed by- Gearldine Bienenstock, PA-C

## 2018-08-29 ENCOUNTER — Other Ambulatory Visit: Payer: Self-pay

## 2018-08-29 ENCOUNTER — Encounter: Payer: Self-pay | Admitting: Rheumatology

## 2018-08-29 ENCOUNTER — Telehealth (INDEPENDENT_AMBULATORY_CARE_PROVIDER_SITE_OTHER): Payer: Managed Care, Other (non HMO) | Admitting: Rheumatology

## 2018-08-29 DIAGNOSIS — M0609 Rheumatoid arthritis without rheumatoid factor, multiple sites: Secondary | ICD-10-CM | POA: Diagnosis not present

## 2018-08-29 DIAGNOSIS — R945 Abnormal results of liver function studies: Secondary | ICD-10-CM

## 2018-08-29 DIAGNOSIS — R7989 Other specified abnormal findings of blood chemistry: Secondary | ICD-10-CM

## 2018-08-29 DIAGNOSIS — M19041 Primary osteoarthritis, right hand: Secondary | ICD-10-CM

## 2018-08-29 DIAGNOSIS — M3500 Sicca syndrome, unspecified: Secondary | ICD-10-CM

## 2018-08-29 DIAGNOSIS — M19042 Primary osteoarthritis, left hand: Secondary | ICD-10-CM

## 2018-08-29 DIAGNOSIS — Z8739 Personal history of other diseases of the musculoskeletal system and connective tissue: Secondary | ICD-10-CM

## 2018-08-29 DIAGNOSIS — M160 Bilateral primary osteoarthritis of hip: Secondary | ICD-10-CM

## 2018-08-29 DIAGNOSIS — Z79899 Other long term (current) drug therapy: Secondary | ICD-10-CM

## 2018-08-29 MED ORDER — ETANERCEPT 50 MG/ML ~~LOC~~ SOAJ: SUBCUTANEOUS | 0 refills | Status: DC

## 2018-08-29 NOTE — Addendum Note (Signed)
Addended by: Ellen Henri on: 08/29/2018 02:38 PM   Modules accepted: Orders

## 2018-09-04 ENCOUNTER — Ambulatory Visit: Payer: Managed Care, Other (non HMO) | Admitting: Rheumatology

## 2018-11-08 ENCOUNTER — Other Ambulatory Visit: Payer: Self-pay | Admitting: Rheumatology

## 2018-11-08 NOTE — Telephone Encounter (Signed)
Last Visit: 08/29/2018 telemedicine  Next Visit: 11/29/2018 Labs: 08/26/2018 elevated glucose, all other labs WNL  TB Gold: 08/26/2018 neg   Okay to refill per Dr. Estanislado Pandy.

## 2018-11-15 NOTE — Progress Notes (Deleted)
Office Visit Note  Patient: Michelle Pierce             Date of Birth: 12-29-1970           MRN: [id number]             PCP: Charlynn Court, NP Referring: Charlynn Court, NP Visit Date: 11/29/2018 Occupation: @GUAROCC @  Subjective:  No chief complaint on file.   History of Present Illness: Michelle Pierce is a 48 y.o. female ***   Activities of Daily Living:  Patient reports morning stiffness for *** {minute/hour:19697}.   Patient {ACTIONS;DENIES/REPORTS:21021675::"Denies"} nocturnal pain.  Difficulty dressing/grooming: {ACTIONS;DENIES/REPORTS:21021675::"Denies"} Difficulty climbing stairs: {ACTIONS;DENIES/REPORTS:21021675::"Denies"} Difficulty getting out of chair: {ACTIONS;DENIES/REPORTS:21021675::"Denies"} Difficulty using hands for taps, buttons, cutlery, and/or writing: {ACTIONS;DENIES/REPORTS:21021675::"Denies"}  No Rheumatology ROS completed.   PMFS History:  Patient Active Problem List   Diagnosis Date Noted  . Dyspnea 02/06/2018  . Stress at home 02/06/2018  . Primary osteoarthritis of both hips 12/30/2016  . History of scoliosis 12/30/2016  . Elevated LFTs 12/30/2016  . Sicca syndrome, unspecified ([place]) 09/02/2016  . Rheumatoid arthritis of multiple sites without rheumatoid factor ([place]) 04/08/2016  . High risk medication use 04/08/2016  . Osteoarthritis, 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    Past Medical History:  Diagnosis Date  . Rheumatoid arthritis ([place])     Family History  Problem Relation Age of Onset  . Hypertension Mother   . Cancer Father    Past Surgical History:  Procedure Laterality Date  . KNEE ARTHROSCOPY Bilateral    Social History   Social History Narrative  . Not on file    There is no immunization history on file for this patient.   Objective: Vital Signs: There were no vitals taken for this visit.   Physical Exam   Musculoskeletal Exam: ***  CDAI Exam: CDAI Score: - Patient Global: -; Provider Global: - Swollen: -; Tender: -  Joint Exam   No joint exam has been documented for this visit   There is currently no information documented on the homunculus. Go to the Rheumatology activity and complete the homunculus joint exam.  Investigation: No additional findings.  Imaging: No results found.  Recent Labs: Lab Results  Component Value Date   WBC 6.0 11/16/2017   HGB 13.1 11/16/2017   PLT 311 11/16/2017   NA 141 11/16/2017   K 4.9 11/16/2017   CL 105 11/16/2017   CO2 29 11/16/2017   GLUCOSE 97 11/16/2017   BUN 9 11/16/2017   CREATININE 0.60 11/16/2017   BILITOT 0.6 11/16/2017   AST 118 (H) 11/16/2017   ALT 152 (H) 11/16/2017   PROT 7.3 11/16/2017   CALCIUM 9.4 11/16/2017   GFRAA 128 11/16/2017    Speciality Comments: No specialty comments available.  Procedures:  No procedures performed Allergies: Sulfa antibiotics, Factive [gemifloxacin], and Penicillins   Assessment / Plan:     Visit Diagnoses: No diagnosis found.   Orders: No orders of the defined types were placed in this encounter.  No orders of the defined types were placed in this encounter.   Face-to-face time spent with patient was *** minutes. Greater than 50% of time was spent in counseling and coordination of care.  Follow-Up Instructions: No follow-ups on file.   Earnestine Mealing, CMA  Note - This record has been created using Editor, commissioning.  Chart creation errors have been sought, but may not always  have been located. Such creation errors do not reflect on  the standard of medical care.

## 2018-11-27 NOTE — Progress Notes (Deleted)
Office Visit Note  Patient: Michelle Pierce             Date of Birth: 04/15/1971           MRN: [id number]             PCP: Charlynn Court, NP Referring: Charlynn Court, NP Visit Date: 12/08/2018 Occupation: @GUAROCC @  Subjective:  No chief complaint on file.   History of Present Illness: Michelle Pierce is a 48 y.o. female ***   Activities of Daily Living:  Patient reports morning stiffness for *** {minute/hour:19697}.   Patient {ACTIONS;DENIES/REPORTS:21021675::"Denies"} nocturnal pain.  Difficulty dressing/grooming: {ACTIONS;DENIES/REPORTS:21021675::"Denies"} Difficulty climbing stairs: {ACTIONS;DENIES/REPORTS:21021675::"Denies"} Difficulty getting out of chair: {ACTIONS;DENIES/REPORTS:21021675::"Denies"} Difficulty using hands for taps, buttons, cutlery, and/or writing: {ACTIONS;DENIES/REPORTS:21021675::"Denies"}  No Rheumatology ROS completed.   PMFS History:  Patient Active Problem List   Diagnosis Date Noted  . Dyspnea 02/06/2018  . Stress at home 02/06/2018  . Primary osteoarthritis of both hips 12/30/2016  . History of scoliosis 12/30/2016  . Elevated LFTs 12/30/2016  . Sicca syndrome, unspecified ([place]) 09/02/2016  . Rheumatoid arthritis of multiple sites without rheumatoid factor ([place]) 04/08/2016  . High risk medication use 04/08/2016  . Osteoarthritis, 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    Past Medical History:  Diagnosis Date  . Rheumatoid arthritis ([place])     Family History  Problem Relation Age of Onset  . Hypertension Mother   . Cancer Father    Past Surgical History:  Procedure Laterality Date  . KNEE ARTHROSCOPY Bilateral    Social History   Social History Narrative  . Not on file    There is no immunization history on file for this patient.   Objective: Vital Signs: There were no vitals taken for this visit.   Physical Exam   Musculoskeletal Exam: ***  CDAI Exam: CDAI Score: - Patient Global: -; Provider Global: - Swollen: -; Tender: -  Joint Exam   No joint exam has been documented for this visit   There is currently no information documented on the homunculus. Go to the Rheumatology activity and complete the homunculus joint exam.  Investigation: No additional findings.  Imaging: No results found.  Recent Labs: Lab Results  Component Value Date   WBC 6.0 11/16/2017   HGB 13.1 11/16/2017   PLT 311 11/16/2017   NA 141 11/16/2017   K 4.9 11/16/2017   CL 105 11/16/2017   CO2 29 11/16/2017   GLUCOSE 97 11/16/2017   BUN 9 11/16/2017   CREATININE 0.60 11/16/2017   BILITOT 0.6 11/16/2017   AST 118 (H) 11/16/2017   ALT 152 (H) 11/16/2017   PROT 7.3 11/16/2017   CALCIUM 9.4 11/16/2017   GFRAA 128 11/16/2017    Speciality Comments: No specialty comments available.  Procedures:  No procedures performed Allergies: Sulfa antibiotics, Factive [gemifloxacin], and Penicillins   Assessment / Plan:     Visit Diagnoses: No diagnosis found.    History of scoliosis  Orders: No orders of the defined types were placed in this encounter.  No orders of the defined types were placed in this encounter.   Face-to-face time spent with patient was *** minutes. Greater than 50% of time was spent in counseling and coordination of care.  Follow-Up Instructions: No follow-ups on file.   Earnestine Mealing, CMA  Note - This record has been created using Editor, commissioning.  Chart creation errors have been sought, but may not always  have been located. Such creation errors do not reflect on  the standard of medical care. 

## 2018-11-29 ENCOUNTER — Ambulatory Visit: Payer: Self-pay | Admitting: Rheumatology

## 2018-12-08 ENCOUNTER — Ambulatory Visit: Payer: Managed Care, Other (non HMO) | Admitting: Physician Assistant

## 2018-12-12 NOTE — Progress Notes (Deleted)
   Office Visit Note  Patient: Michelle Pierce             Date of Birth: 1970/09/23           MRN: [id number]             PCP: Charlynn Court, NP Referring: Charlynn Court, NP Visit Date: 12/26/2018 Occupation: @GUAROCC @  Subjective:  Pierce chief complaint on file.   History of Present Illness: Michelle Pierce is a 48 y.o. female ***   Activities of Daily Living:  Patient reports morning stiffness for *** {minute/hour:19697}.   Patient {ACTIONS;DENIES/REPORTS:21021675::"Denies"} nocturnal pain.  Difficulty dressing/grooming: {ACTIONS;DENIES/REPORTS:21021675::"Denies"} Difficulty climbing stairs: {ACTIONS;DENIES/REPORTS:21021675::"Denies"} Difficulty getting out of chair: {ACTIONS;DENIES/REPORTS:21021675::"Denies"} Difficulty using hands for taps, buttons, cutlery, and/or writing: {ACTIONS;DENIES/REPORTS:21021675::"Denies"}  Pierce Rheumatology ROS completed.   PMFS History:  Patient Active Problem List   Diagnosis Date Noted  . Dyspnea 02/06/2018  . Stress at home 02/06/2018  . Primary osteoarthritis of both hips 12/30/2016  . History of scoliosis 12/30/2016  . Elevated LFTs 12/30/2016  . Sicca syndrome, unspecified ([place]) 09/02/2016  . Rheumatoid arthritis of multiple sites without rheumatoid factor ([place]) 04/08/2016  . High risk medication use 04/08/2016  . Osteoarthritis, 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    Past Medical History:  Diagnosis Date  . Rheumatoid arthritis ([place])     Family History  Problem Relation Age of Onset  . Hypertension Mother   . Cancer Father    Past Surgical History:  Procedure Laterality Date  . KNEE ARTHROSCOPY Bilateral    Social History   Social History Narrative  . Not on file    There is Pierce immunization history on file for this patient.   Objective: Vital Signs: There were Pierce vitals taken for this visit.   Physical Exam   Musculoskeletal Exam: ***  CDAI Exam: CDAI Score: - Patient Global: -; Provider Global: - Swollen: -; Tender: -  Joint Exam   Pierce joint exam has been documented for this visit   There is currently Pierce information documented on the homunculus. Go to the Rheumatology activity and complete the homunculus joint exam.  Investigation: Pierce additional findings.  Imaging: Pierce results found.  Recent Labs: Lab Results  Component Value Date   WBC 6.0 11/16/2017   HGB 13.1 11/16/2017   PLT 311 11/16/2017   NA 141 11/16/2017   K 4.9 11/16/2017   CL 105 11/16/2017   CO2 29 11/16/2017   GLUCOSE 97 11/16/2017   BUN 9 11/16/2017   CREATININE 0.60 11/16/2017   BILITOT 0.6 11/16/2017   AST 118 (H) 11/16/2017   ALT 152 (H) 11/16/2017   PROT 7.3 11/16/2017   CALCIUM 9.4 11/16/2017   GFRAA 128 11/16/2017    Speciality Comments: Pierce specialty comments available.  Procedures:  Pierce procedures performed Allergies: Sulfa antibiotics, Factive [gemifloxacin], and Penicillins   Assessment / Plan:     Visit Diagnoses: Pierce diagnosis found.  Orders: Pierce orders of the defined types were placed in this encounter.  Pierce orders of the defined types were placed in this encounter.   Face-to-face time spent with patient was *** minutes. Greater than 50% of time was spent in counseling and coordination of care.  Follow-Up Instructions: Pierce follow-ups on file.   Earnestine Mealing, CMA  Note - This record has been created using Editor, commissioning.  Chart creation errors have been sought, but may not always  have been located. Such creation errors do not reflect on  the standard of medical care.

## 2018-12-26 ENCOUNTER — Ambulatory Visit: Payer: Managed Care, Other (non HMO) | Admitting: Rheumatology

## 2018-12-27 DIAGNOSIS — M0800 Unspecified juvenile rheumatoid arthritis of unspecified site: Secondary | ICD-10-CM | POA: Insufficient documentation

## 2018-12-28 ENCOUNTER — Ambulatory Visit: Payer: Managed Care, Other (non HMO) | Admitting: Sports Medicine

## 2018-12-28 ENCOUNTER — Ambulatory Visit: Payer: Self-pay

## 2018-12-28 ENCOUNTER — Encounter: Payer: Self-pay | Admitting: Sports Medicine

## 2018-12-28 ENCOUNTER — Other Ambulatory Visit: Payer: Self-pay

## 2018-12-28 ENCOUNTER — Other Ambulatory Visit: Payer: Self-pay | Admitting: Sports Medicine

## 2018-12-28 DIAGNOSIS — M79674 Pain in right toe(s): Secondary | ICD-10-CM

## 2018-12-28 DIAGNOSIS — M79675 Pain in left toe(s): Secondary | ICD-10-CM

## 2018-12-28 DIAGNOSIS — M779 Enthesopathy, unspecified: Secondary | ICD-10-CM | POA: Diagnosis not present

## 2018-12-28 DIAGNOSIS — B351 Tinea unguium: Secondary | ICD-10-CM | POA: Diagnosis not present

## 2018-12-28 DIAGNOSIS — L84 Corns and callosities: Secondary | ICD-10-CM

## 2018-12-28 DIAGNOSIS — M19071 Primary osteoarthritis, right ankle and foot: Secondary | ICD-10-CM

## 2018-12-28 DIAGNOSIS — M0800 Unspecified juvenile rheumatoid arthritis of unspecified site: Secondary | ICD-10-CM

## 2018-12-28 DIAGNOSIS — M79672 Pain in left foot: Secondary | ICD-10-CM

## 2018-12-28 DIAGNOSIS — M7741 Metatarsalgia, right foot: Secondary | ICD-10-CM

## 2018-12-28 DIAGNOSIS — M79671 Pain in right foot: Secondary | ICD-10-CM

## 2018-12-28 DIAGNOSIS — Q663 Other congenital varus deformities of feet, unspecified foot: Secondary | ICD-10-CM

## 2018-12-28 DIAGNOSIS — M7742 Metatarsalgia, left foot: Secondary | ICD-10-CM

## 2018-12-28 NOTE — Progress Notes (Signed)
Subjective: Michelle Pierce is a 48 y.o. female patient seen today in office with complaint of mildly painful thickened and discolored nails. Patient is desiring treatment for nail changes; has tried OTC topicals/Medication in the past with no improvement. Reports that nails are becoming difficult to manage because of the thickness at 1st toes bilateral. Reports that there is callus at the bottoms of both feet R>L has tried OTC remover with no issues. Patient admits to also some ocassional pain at the top and side of the right foot for years, has not tried any treatments. Patient has no other pedal complaints at this time.   Review of Systems  Musculoskeletal: Positive for joint pain and myalgias.  All other systems reviewed and are negative.  Patient Active Problem List   Diagnosis Date Noted  . 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 Anxiety disorder 07/27/2018  . 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 Attention deficit hyperactivity disorder (ADHD), predominantly inattentive type 07/27/2018  . Malaise and fatigue 07/27/2018  . Overweight 07/27/2018  . Dyspnea 02/06/2018  . Stress at home 02/06/2018  . Primary osteoarthritis of both hips 12/30/2016  . History of scoliosis 12/30/2016  . Elevated LFTs 12/30/2016  . Sicca syndrome, unspecified ([place]) 09/02/2016  . Rheumatoid arthritis of multiple sites without rheumatoid factor ([place]) 04/08/2016  . High risk medication use 04/08/2016  . Osteoarthritis, 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    Current Outpatient Medications on File Prior to Visit  Medication Sig Dispense Refill  . cycloSPORINE (RESTASIS) 0.05 % ophthalmic emulsion Place 1 drop into both eyes daily.    Marland Kitchen etanercept (ENBREL SURECLICK) 50 MG/ML injection INJECT 1 PEN UNDER THE SKIN EVERY 7 DAYS. 11.76 mL 0  . fexofenadine (ALLEGRA) 180 MG tablet Take 180 mg by mouth daily.    . folic acid (FOLVITE) 1 MG tablet TAKE TWO TABLETS BY MOUTH EVERY DAY 180 tablet 3  . levocetirizine (XYZAL) 5  MG tablet Take 5 mg by mouth every evening.  0  . lisdexamfetamine (VYVANSE) 50 MG capsule Vyvanse 50 mg capsule     No current facility-administered medications on file prior to visit.     Allergies  Allergen Reactions  . Sulfa Antibiotics Other (See Comments)    Elevated AST/ALT  . Factive [Gemifloxacin] Hives  . Penicillins Hives    Objective: Physical Exam  General: Well developed, nourished, no acute distress, awake, alert and oriented x 3  Vascular: Dorsalis pedis artery 2/4 bilateral, Posterior tibial artery 2/4 bilateral, skin temperature warm to warm proximal to distal bilateral lower extremities, no varicosities, pedal hair present bilateral.  Neurological: Gross sensation present via light touch bilateral.   Dermatological: Skin is warm, dry, and supple bilateral, Bilateral hallux nails are tender, short thick, and discolored with mild subungal debris, no webspace macerations present bilateral, no open lesions present bilateral, + Callus hyperkeratotic tissue present bilateral sub 5. No signs of infection bilateral.  Musculoskeletal: + prominent 5th met heads, boney deformities noted bilateral. Muscular strength within normal limits without painon range of motion. Varus foot type.   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[place])    Other Visit Diagnoses    Nail fungus    -  Primary   Relevant Orders   Culture, fungus without smear   Toe pain, bilateral       Callus of foot       Capsulitis  Metatarsalgia of both feet       Varus deformity of foot       Arthritis of foot, right          -Examined patient -Discuss treatment options for callus and foot pain -Mechanically debrided callus using a sterile chisel blade without incident -Applied offloading padding and advised patient that if this works well will benefit from custom orthotics; office to contact patient  regarding this -Advised patient that we will continue to monitor her symptoms on right foot -Discussed treatment options for painful dystrophic nails  -Fungal culture was obtained by removing a portion of the hard nail itself from each of the involved toenails using a sterile nail nipper and sent to [facility] lab. Patient tolerated the biopsy procedure well without discomfort or need for anesthesia.  -Patient to return in 4 weeks for follow up evaluation and discussion of fungal culture results or sooner if symptoms worsen.  Landis Martins, DPM

## 2019-01-08 NOTE — Progress Notes (Signed)
Office Visit Note  Patient: Michelle Pierce             Date of Birth: 06/05/1970           MRN: [id number]             PCP: Hal MoralesGunter, Tara G, NP Referring: Hal MoralesGunter, Tara G, NP Visit Date: 01/22/2019 Occupation: @GUAROCC @  Subjective:  Pain in both feet     History of Present Illness: Michelle Pierce is a 48 y.o. female with history of seronegative rheumatoid arthritis and osteoarthritis.  She is on Enbrel 50 mg sq weekly injections.  She denies any recent flares. She has not noticed any increased inflammation since discontinuing MTX due to elevated LFTs. She has been having increased lower back pain and has been seeing a Landchiropractor.  She has been experiencing pain in both feet.  She has a callus under the right 5th MTP joint.  She followed up with a podiatrist.   Activities of Daily Living:  Patient reports morning stiffness for 0 minutes.   Patient Denies nocturnal pain.  Difficulty dressing/grooming: Denies Difficulty climbing stairs: Denies Difficulty getting out of chair: Denies Difficulty using hands for taps, buttons, cutlery, and/or writing: Denies  Review of Systems  Constitutional: Positive for fatigue.  HENT: Positive for mouth dryness. Negative for mouth sores and nose dryness.   Eyes: Positive for dryness. Negative for pain and visual disturbance.  Respiratory: Negative for cough, hemoptysis, shortness of breath and difficulty breathing.   Cardiovascular: Negative for chest pain, palpitations, hypertension and swelling in legs/feet.  Gastrointestinal: Negative for blood in stool, constipation and diarrhea.  Endocrine: Negative for increased urination.  Genitourinary: Negative for painful urination.  Musculoskeletal: Positive for arthralgias and joint pain. Negative for joint swelling, myalgias, muscle weakness, morning stiffness, muscle tenderness and myalgias.  Skin: Negative for color change, pallor, rash, hair loss, nodules/bumps, skin tightness, ulcers and  sensitivity to sunlight.  Allergic/Immunologic: Negative for susceptible to infections.  Neurological: Negative for dizziness, numbness, headaches and weakness.  Hematological: Negative for swollen glands.  Psychiatric/Behavioral: Negative for depressed mood and sleep disturbance. The patient is not nervous/anxious.     PMFS History:  Patient Active Problem List   Diagnosis Date Noted  . Juvenile rheumatoid arthritis (HCC) 12/27/2018  . Anxiety disorder 07/27/2018  . 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 Attention deficit hyperactivity disorder (ADHD), predominantly inattentive type 07/27/2018  . Malaise and fatigue 07/27/2018  . Overweight 07/27/2018  . Dyspnea 02/06/2018  . Stress at home 02/06/2018  . Primary osteoarthritis of both hips 12/30/2016  . History of scoliosis 12/30/2016  . Elevated LFTs 12/30/2016  . Sicca syndrome, unspecified (HCC) 09/02/2016  . Rheumatoid arthritis of multiple sites without rheumatoid factor (HCC) 04/08/2016  . High risk medication use 04/08/2016  . Osteoarthritis, 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    Past Medical History:  Diagnosis Date  . Rheumatoid arthritis (HCC)     Family History  Problem Relation Age of Onset  . Hypertension Mother   . Cancer Father    Past Surgical History:  Procedure Laterality Date  . KNEE ARTHROSCOPY Bilateral    Social History   Social History Narrative  . Not on file   Immunization History  Administered Date(s) Administered  . Influenza-Unspecified 03/07/2014, 02/24/2015, 02/22/2016     Objective: Vital Signs: There were no vitals taken for this visit.   Physical Exam Vitals signs and nursing note reviewed.  Constitutional:      Appearance: She is well-developed.  HENT:  Head: Normocephalic and atraumatic.  Eyes:     Conjunctiva/sclera: Conjunctivae normal.  Neck:     Musculoskeletal: Normal range of motion.  Cardiovascular:     Rate and Rhythm: Normal rate and regular rhythm.     Heart sounds: Normal  heart sounds.  Pulmonary:     Effort: Pulmonary effort is normal.     Breath sounds: Normal breath sounds.  Abdominal:     General: Bowel sounds are normal.     Palpations: Abdomen is soft.  Lymphadenopathy:     Cervical: No cervical adenopathy.  Skin:    General: Skin is warm and dry.     Capillary Refill: Capillary refill takes less than 2 seconds.  Neurological:     Mental Status: She is alert and oriented to person, place, and time.  Psychiatric:        Behavior: Behavior normal.      Musculoskeletal Exam: C-spine, thoracic spine, and lumbar spine good ROM. No midline spinal tenderness.  No SI joint tenderness.  Shoulder joints, elbow joints, wrist joints, MCPs, PIPs, and DIPs good ROM with no synovitis.  Hip joints, knee joints, ankle joints, MTPs, PIPs, and DIPs good ROM with no synovitis.   Tenderness of MTP joints. No warmth or effusion of knee joints.  No tenderness or swelling of ankle joints.    Callus under right 5th toe.   CDAI Exam: CDAI Score: - Patient Global: -; Provider Global: - Swollen: -; Tender: - Joint Exam   No joint exam has been documented for this visit   There is currently no information documented on the homunculus. Go to the Rheumatology activity and complete the homunculus joint exam.  Investigation: No additional findings.  Imaging: No results found.  Recent Labs: Lab Results  Component Value Date   WBC 6.0 11/16/2017   HGB 13.1 11/16/2017   PLT 311 11/16/2017   NA 141 11/16/2017   K 4.9 11/16/2017   CL 105 11/16/2017   CO2 29 11/16/2017   GLUCOSE 97 11/16/2017   BUN 9 11/16/2017   CREATININE 0.60 11/16/2017   BILITOT 0.6 11/16/2017   AST 118 (H) 11/16/2017   ALT 152 (H) 11/16/2017   PROT 7.3 11/16/2017   CALCIUM 9.4 11/16/2017   GFRAA 128 11/16/2017    Speciality Comments: No specialty comments available.  Procedures:  No procedures performed Allergies: Sulfa antibiotics, Factive [gemifloxacin], and Penicillins    Assessment / Plan:     Visit Diagnoses: Rheumatoid arthritis of multiple sites without rheumatoid factor ([place]) -she has no synovitis on exam.  She has not had any recent rheumatoid arthritis flares.  She has been having increased pain in bilateral feet and was evaluated by a podiatrist.  She has tenderness of MTP joints but no synovitis was noted on exam today.  X-rays of both hands and feet were obtained today to assess for radiographic progression.  Overall she is clinically been doing well on Enbrel 50 mg subcutaneous weekly injections.  She discontinued methotrexate in June 2019 due to elevated LFTs.  LFTs were within normal limits on 08/27/2018.  We will obtain lab work today.  She will continue on the current treatment regimen.  She was advised to notify us if she develops increased joint pain or joint swelling.  She will follow-up in the office in 5 months.  Plan: XR Hand 2 View Right, XR Hand 2 View Left, XR Foot 2 Views Right, XR Foot 2 Views Left.  The x-rays of the hand showed mild  juxta-articular osteopenia.  Mild first second and third MCP joint narrowing and intercarpal radiocarpal joint space narrowing.  No erosive changes were noted.  High risk medication use - Enbrel SureClick 50 mg every 7 days.  Methotrexate discontinued due to elevated LFTs in June 2019.  Last TB: Negative on 08/26/2018 and will monitor yearly.  Most recent CBC/CMP within normal limits on 08/26/2018.  Due for CBC/CMP today and will monitor every 3 months.  Standing orders are in place.  - Plan: CBC with Differential/Platelet, COMPLETE METABOLIC PANEL WITH GFR  Primary osteoarthritis of both hands: She has no tenderness or synovitis on exam.  She has complete fist formation bilaterally.  Joint protection and muscle strengthening were discussed.   Primary osteoarthritis of both hips: She has good ROM with no discomfort.   Sicca syndrome, unspecified (HCC): She has chronic sicca symptoms.  She uses restasis eye drops daily.     History of scoliosis: She sees a Landchiropractor on a regular basis.   Elevated LFTs - She discontinued MTX due to elevated LFTs in June 2019.  AST 25 and ALT 30 on 08/27/18.  CMP was ordered today.  Pain in both feet - She has been experiencing increased pain in both feet.  She has tenderness over all MTP joints.  No synovitis noted.  She has a callus under the right 5th toe.  She has been seeing a podiatrist.  She wears proper fitting shoes.  X-rays of both feet were obtained today.Plan: XR Foot 2 Views Right, XR Foot 2 Views Left.  The x-ray showed osteoarthritic changes and some rheumatoid arthritis changes.  No erosive changes were noted.  Varus changes were noted in the bilateral fifth MTP joints.  Orders: Orders Placed This Encounter  Procedures  . XR Hand 2 View Right  . XR Hand 2 View Left  . XR Foot 2 Views Right  . XR Foot 2 Views Left  . CBC with Differential/Platelet  . COMPLETE METABOLIC PANEL WITH GFR   No orders of the defined types were placed in this encounter.   Face-to-face time spent with patient was 30 minutes. Greater than 50% of time was spent in counseling and coordination of care.  Follow-Up Instructions: Return in about 5 months (around 06/24/2019) for Rheumatoid arthritis, Osteoarthritis.   Gearldine Bienenstockaylor M Shalene Gallen, PA-C   I examined and evaluated the patient with Michelle Alesaylor Jakorian Marengo PA.  Patient had no synovitis on my examination.  She has been experiencing increased feet discomfort.  The x-ray showed no radiographic progression and no erosive changes.  She had varus deformity in her bilateral fifth MTP joints.  She has been having stiffness in her hands.  She has some MCP intercarpal and radiocarpal joint space narrowing.  No erosive changes were noted.  At this time she wants to stay on Enbrel monotherapy.  The plan of care was discussed as noted above.  Michelle SavoyShaili Deveshwar, MD  Note - This record has been created using Animal nutritionistDragon software.  Chart creation errors have been sought, but  may not always  have been located. Such creation errors do not reflect on  the standard of medical care.

## 2019-01-22 ENCOUNTER — Ambulatory Visit: Payer: Self-pay

## 2019-01-22 ENCOUNTER — Ambulatory Visit (INDEPENDENT_AMBULATORY_CARE_PROVIDER_SITE_OTHER): Payer: Managed Care, Other (non HMO)

## 2019-01-22 ENCOUNTER — Ambulatory Visit: Payer: Managed Care, Other (non HMO) | Admitting: Rheumatology

## 2019-01-22 ENCOUNTER — Encounter: Payer: Self-pay | Admitting: Physician Assistant

## 2019-01-22 ENCOUNTER — Telehealth: Payer: Self-pay | Admitting: *Deleted

## 2019-01-22 ENCOUNTER — Other Ambulatory Visit: Payer: Self-pay

## 2019-01-22 VITALS — BP 129/81 | HR 82 | Resp 13 | Ht 66.0 in | Wt 172.8 lb

## 2019-01-22 DIAGNOSIS — M0609 Rheumatoid arthritis without rheumatoid factor, multiple sites: Secondary | ICD-10-CM

## 2019-01-22 DIAGNOSIS — M160 Bilateral primary osteoarthritis of hip: Secondary | ICD-10-CM

## 2019-01-22 DIAGNOSIS — M79672 Pain in left foot: Secondary | ICD-10-CM

## 2019-01-22 DIAGNOSIS — M19041 Primary osteoarthritis, right hand: Secondary | ICD-10-CM | POA: Diagnosis not present

## 2019-01-22 DIAGNOSIS — M79671 Pain in right foot: Secondary | ICD-10-CM

## 2019-01-22 DIAGNOSIS — M19042 Primary osteoarthritis, left hand: Secondary | ICD-10-CM

## 2019-01-22 DIAGNOSIS — Z8739 Personal history of other diseases of the musculoskeletal system and connective tissue: Secondary | ICD-10-CM

## 2019-01-22 DIAGNOSIS — R945 Abnormal results of liver function studies: Secondary | ICD-10-CM

## 2019-01-22 DIAGNOSIS — Z79899 Other long term (current) drug therapy: Secondary | ICD-10-CM | POA: Diagnosis not present

## 2019-01-22 DIAGNOSIS — M3500 Sicca syndrome, unspecified: Secondary | ICD-10-CM

## 2019-01-22 DIAGNOSIS — R7989 Other specified abnormal findings of blood chemistry: Secondary | ICD-10-CM

## 2019-01-22 NOTE — Telephone Encounter (Signed)
Patient cancelled her appointment because she was feeling better but then remembered that she was supposed to get the results of her toe nails.  Patient wanted to know if she could get those results over the phone.  Please advise

## 2019-01-23 LAB — COMPLETE METABOLIC PANEL WITH GFR
AG Ratio: 1.6 (calc) (ref 1.0–2.5)
ALT: 26 U/L (ref 6–29)
AST: 24 U/L (ref 10–35)
Albumin: 4.1 g/dL (ref 3.6–5.1)
Alkaline phosphatase (APISO): 61 U/L (ref 31–125)
BUN: 7 mg/dL (ref 7–25)
CO2: 30 mmol/L (ref 20–32)
Calcium: 9.5 mg/dL (ref 8.6–10.2)
Chloride: 103 mmol/L (ref 98–110)
Creat: 0.6 mg/dL (ref 0.50–1.10)
GFR, Est African American: 127 mL/min/{1.73_m2} (ref >=60)
GFR, Est Non African American: 109 mL/min/{1.73_m2} (ref >=60)
Globulin: 2.6 g/dL (calc) (ref 1.9–3.7)
Glucose, Bld: 84 mg/dL (ref 65–99)
Potassium: 4.2 mmol/L (ref 3.5–5.3)
Sodium: 140 mmol/L (ref 135–146)
Total Bilirubin: 0.6 mg/dL (ref 0.2–1.2)
Total Protein: 6.7 g/dL (ref 6.1–8.1)

## 2019-01-23 LAB — CBC WITH DIFFERENTIAL/PLATELET
Absolute Monocytes: 464 cells/uL (ref 200–950)
Basophils Absolute: 48 cells/uL (ref 0–200)
Basophils Relative: 0.6 %
Eosinophils Absolute: 504 cells/uL — ABNORMAL HIGH (ref 15–500)
Eosinophils Relative: 6.3 %
HCT: 39.1 % (ref 35.0–45.0)
Hemoglobin: 13.1 g/dL (ref 11.7–15.5)
Lymphs Abs: 2376 cells/uL (ref 850–3900)
MCH: 29.4 pg (ref 27.0–33.0)
MCHC: 33.5 g/dL (ref 32.0–36.0)
MCV: 87.9 fL (ref 80.0–100.0)
MPV: 10.7 fL (ref 7.5–12.5)
Monocytes Relative: 5.8 %
Neutro Abs: 4608 cells/uL (ref 1500–7800)
Neutrophils Relative %: 57.6 %
Platelets: 300 10*3/uL (ref 140–400)
RBC: 4.45 10*6/uL (ref 3.80–5.10)
RDW: 11.8 % (ref 11.0–15.0)
Total Lymphocyte: 29.7 %
WBC: 8 10*3/uL (ref 3.8–10.8)

## 2019-01-23 NOTE — Progress Notes (Signed)
Labs are WNL.

## 2019-01-24 NOTE — Telephone Encounter (Signed)
Yes I can call her with the results. Will you put a sticky note to remind me to do it on tomorrow on my desk. Thanks Dr. Cannon Kettle

## 2019-01-25 ENCOUNTER — Ambulatory Visit: Payer: Managed Care, Other (non HMO) | Admitting: Sports Medicine

## 2019-01-26 ENCOUNTER — Telehealth: Payer: Self-pay | Admitting: Sports Medicine

## 2019-01-26 NOTE — Telephone Encounter (Signed)
Fungal culture results are negative changes to toenail consistent with microtrauma recommend trimming and use of urea topical. Left this message on voicemail since patient did not answer when I called.  _Dr. Chauncey Cruel

## 2019-02-01 ENCOUNTER — Other Ambulatory Visit: Payer: Self-pay | Admitting: Rheumatology

## 2019-02-01 NOTE — Telephone Encounter (Signed)
Last visit: 01/22/19 Next Visit: 06/26/19  Okay to refill per Dr. Estanislado Pandy

## 2019-05-02 ENCOUNTER — Telehealth: Payer: Self-pay | Admitting: Rheumatology

## 2019-05-02 NOTE — Telephone Encounter (Signed)
She can restart Enbrel injections if she is asymptomatic after 1 month

## 2019-05-02 NOTE — Telephone Encounter (Signed)
Advised patient to hold enbrel and she can restart Enbrel injections if she is asymptomatic after 1 month. Patient verbalized understanding.

## 2019-05-02 NOTE — Telephone Encounter (Signed)
Patient tested positive for Covid Monday. Patient was due for Enbrel injection on Friday 04/27/19, but did not take it due to not feeling well. Patient would like to know if she needs to continue to hold off on medication for now? Please call to advise.

## 2019-06-04 ENCOUNTER — Other Ambulatory Visit: Payer: Self-pay | Admitting: Rheumatology

## 2019-06-04 NOTE — Telephone Encounter (Signed)
Last visit: 01/22/19 Next Visit: 06/26/19 Labs: 01/22/19 WNL TB Gold: 08/26/2018 Negative

## 2019-06-18 NOTE — Progress Notes (Signed)
Virtual Visit via Telephone Note  I connected with Michelle Pierce on 06/25/19 at 11:00 AM EST by telephone and verified that I am speaking with the correct person using two identifiers.  Location: Patient: Home  Provider: Clinic  This service was conducted via virtual visit. The patient was located at home. I was located in my office.  Consent was obtained prior to the virtual visit and is aware of possible charges through their insurance for this visit.  The patient is an established patient.  Dr. Corliss Skains, MD conducted the virtual visit and Sherron Ales, PA-C acted as scribe during the service.  Office staff helped with scheduling follow up visits after the service was conducted.   I discussed the limitations, risks, security and privacy concerns of performing an evaluation and management service by telephone and the availability of in person appointments. I also discussed with the patient that there may be a patient responsible charge related to this service. The patient expressed understanding and agreed to proceed.  CC: Pain in both feet  History of Present Illness: Patient is a 49 year old female with a past medical history of seronegative rheumatoid arthritis and osteoarthritis. She is on Enbrel 50 mg sq injections once weekly.  She denies any recent rheumatoid arthritis flares.  She has chronic pain in both feet, and she states she had an appointment with a podiatrist who recommended orthotics.  She has not been fitted for orthotics yet. She denies any joint swelling at this time.   Review of Systems  Constitutional: Negative for fever and malaise/fatigue.  HENT:       +Dry mouth  Eyes: Negative for photophobia, pain, discharge and redness.       +Dry eyes  Respiratory: Negative for cough, shortness of breath and wheezing.   Cardiovascular: Negative for chest pain and palpitations.  Gastrointestinal: Negative for blood in stool, constipation and diarrhea.  Genitourinary: Negative  for dysuria.  Musculoskeletal: Positive for joint pain. Negative for back pain, myalgias and neck pain.  Skin: Negative for rash.  Neurological: Negative for dizziness and headaches.  Psychiatric/Behavioral: Negative for depression. The patient is not nervous/anxious and does not have insomnia.       Observations/Objective: Physical Exam  Constitutional: She is oriented to person, place, and time.  Neurological: She is alert and oriented to person, place, and time.  Psychiatric: Mood, memory, affect and judgment normal.    Patient reports morning stiffness for 0  minutes.   Patient denies nocturnal pain.  Difficulty dressing/grooming: Denies Difficulty climbing stairs: Denies Difficulty getting out of chair: Denies Difficulty using hands for taps, buttons, cutlery, and/or writing: Denies  Assessment and Plan: Visit Diagnoses: Rheumatoid arthritis of multiple sites without rheumatoid factor (HCC): She has not had any recent rheumatoid arthritis flares. She held Enbrel for 1 month after being diagnosed with COVID-19 on 04/24/19.  She experienced increased pain in the right foot while holding Enbrel.  Her symptoms have resolved since resuming Enbrel 50 mg sq injections once weekly. She has no morning stiffness or nocturnal pain. She was advised to notify us if she develops increased joint pain or joint inflammation. She will follow up in 3 months.   High risk medication use - Enbrel SureClick 50 mg every 7 days.  Methotrexate discontinued due to elevated LFTs in June 2019.  Last TB: Negative on 08/26/2018 and will monitor yearly.  CBC and CMP were drawn on 01/22/19.  She is overdue to update lab work.   Primary osteoarthritis  of both hands: She is not having any pain or joint swelling in her hands at this time.  She has no difficulty with ADLs.   Primary osteoarthritis of both hips: She is not experiencing any hip joint pain at this time.   Primary osteoarthritis of both feet: She has  chronic pain in both feet.  She was evaluated by a podiatrist who recommended orthotics, but she has not been fitted for these yet.   Sicca syndrome ([place]): She has chronic sicca symptoms.  She uses restasis eye drops daily.    History of scoliosis: She sees a Restaurant manager, fast food on a regular basis.   Elevated LFTs - She discontinued MTX due to elevated LFTs in June 2019.  LFTs WNL on 01/22/19.  She is overdue to update lab work.    Follow Up Instructions: She will follow up in 3 months    I discussed the assessment and treatment plan with the patient. The patient was provided an opportunity to ask questions and all were answered. The patient agreed with the plan and demonstrated an understanding of the instructions.   The patient was advised to call back or seek an in-person evaluation if the symptoms worsen or if the condition fails to improve as anticipated.  I provided 15 minutes of non-face-to-face time during this encounter.   Bo Merino, MD   Scribed byLovena Le Dale,PA-C

## 2019-06-25 ENCOUNTER — Telehealth (INDEPENDENT_AMBULATORY_CARE_PROVIDER_SITE_OTHER): Payer: Managed Care, Other (non HMO) | Admitting: Rheumatology

## 2019-06-25 ENCOUNTER — Encounter: Payer: Self-pay | Admitting: Rheumatology

## 2019-06-25 ENCOUNTER — Other Ambulatory Visit: Payer: Self-pay

## 2019-06-25 DIAGNOSIS — M160 Bilateral primary osteoarthritis of hip: Secondary | ICD-10-CM | POA: Diagnosis not present

## 2019-06-25 DIAGNOSIS — R7989 Other specified abnormal findings of blood chemistry: Secondary | ICD-10-CM

## 2019-06-25 DIAGNOSIS — M3500 Sicca syndrome, unspecified: Secondary | ICD-10-CM

## 2019-06-25 DIAGNOSIS — M19041 Primary osteoarthritis, right hand: Secondary | ICD-10-CM

## 2019-06-25 DIAGNOSIS — M19042 Primary osteoarthritis, left hand: Secondary | ICD-10-CM

## 2019-06-25 DIAGNOSIS — Z8739 Personal history of other diseases of the musculoskeletal system and connective tissue: Secondary | ICD-10-CM

## 2019-06-25 DIAGNOSIS — R202 Paresthesia of skin: Secondary | ICD-10-CM

## 2019-06-25 DIAGNOSIS — Z79899 Other long term (current) drug therapy: Secondary | ICD-10-CM | POA: Diagnosis not present

## 2019-06-25 DIAGNOSIS — M0609 Rheumatoid arthritis without rheumatoid factor, multiple sites: Secondary | ICD-10-CM | POA: Diagnosis not present

## 2019-06-26 ENCOUNTER — Ambulatory Visit: Payer: Managed Care, Other (non HMO) | Admitting: Rheumatology

## 2019-07-09 ENCOUNTER — Other Ambulatory Visit: Payer: Self-pay

## 2019-07-09 ENCOUNTER — Telehealth: Payer: Self-pay | Admitting: Rheumatology

## 2019-07-09 DIAGNOSIS — Z79899 Other long term (current) drug therapy: Secondary | ICD-10-CM

## 2019-07-09 DIAGNOSIS — Z111 Encounter for screening for respiratory tuberculosis: Secondary | ICD-10-CM

## 2019-07-09 NOTE — Telephone Encounter (Signed)
Patient request for lab orders to be sent to [facility] in [place]. Patient going this week.

## 2019-07-09 NOTE — Telephone Encounter (Signed)
Lab orders faxed to [facility] [place].

## 2019-07-17 ENCOUNTER — Telehealth: Payer: Self-pay | Admitting: Pharmacist

## 2019-07-17 NOTE — Telephone Encounter (Signed)
Received prior authorization request via fax from [facility].  Completed form and faxed to [facility] for approval.  Will update when we receive a response.  PA # [phone] Fax # [phone]   Verlin Fester, PharmD, [place], CPP Clinical Specialty Pharmacist [phone]  07/17/2019 8:50 AM

## 2019-07-19 NOTE — Telephone Encounter (Signed)
Received notification from [facility] regarding a prior authorization for ENBREL. Authorization has been APPROVED from 07/17/19 to 07/16/20.    Michelle Pierce, PharmD, [place], CPP Clinical Specialty Pharmacist [phone]  07/19/2019 1:58 PM

## 2019-07-27 ENCOUNTER — Telehealth: Payer: Self-pay | Admitting: *Deleted

## 2019-07-27 NOTE — Telephone Encounter (Signed)
Patient called the office to ask if Dr. Corliss Skains recommended the Covid vaccine with her being on Enbrel. Patient advised Dr. Corliss Skains does recommend the Covid vaccine and she does not need to hold her medication.

## 2019-08-31 ENCOUNTER — Other Ambulatory Visit: Payer: Self-pay | Admitting: Rheumatology

## 2019-09-02 IMAGING — DX DG CHEST 2V
2 series · 2 of 2 positions shown · non-contrast
Comparison: None.

CLINICAL DATA: Shortness of breath for 3 months. History of
rheumatoid arthritis. High risk medication use

EXAM:
CHEST - 2 VIEW

[chest pa]
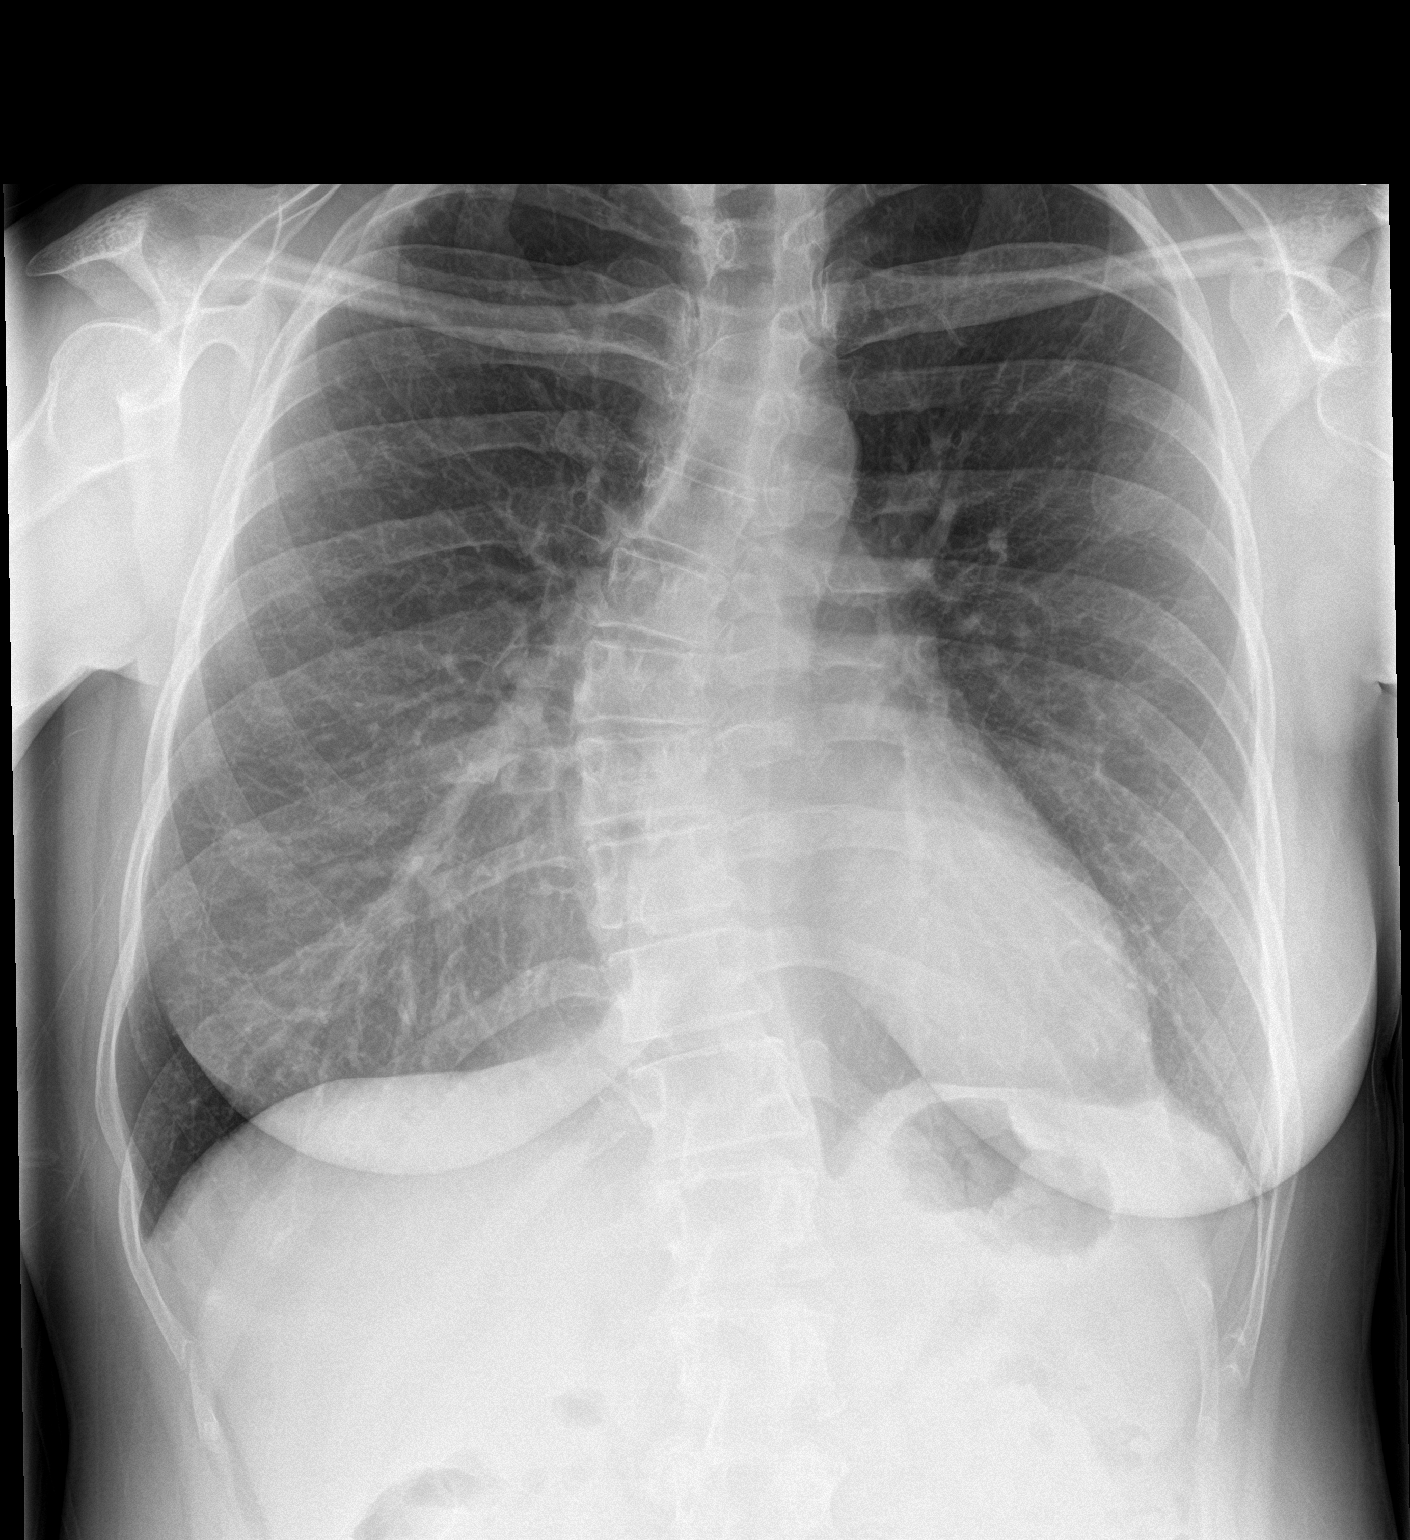

[chest lat]
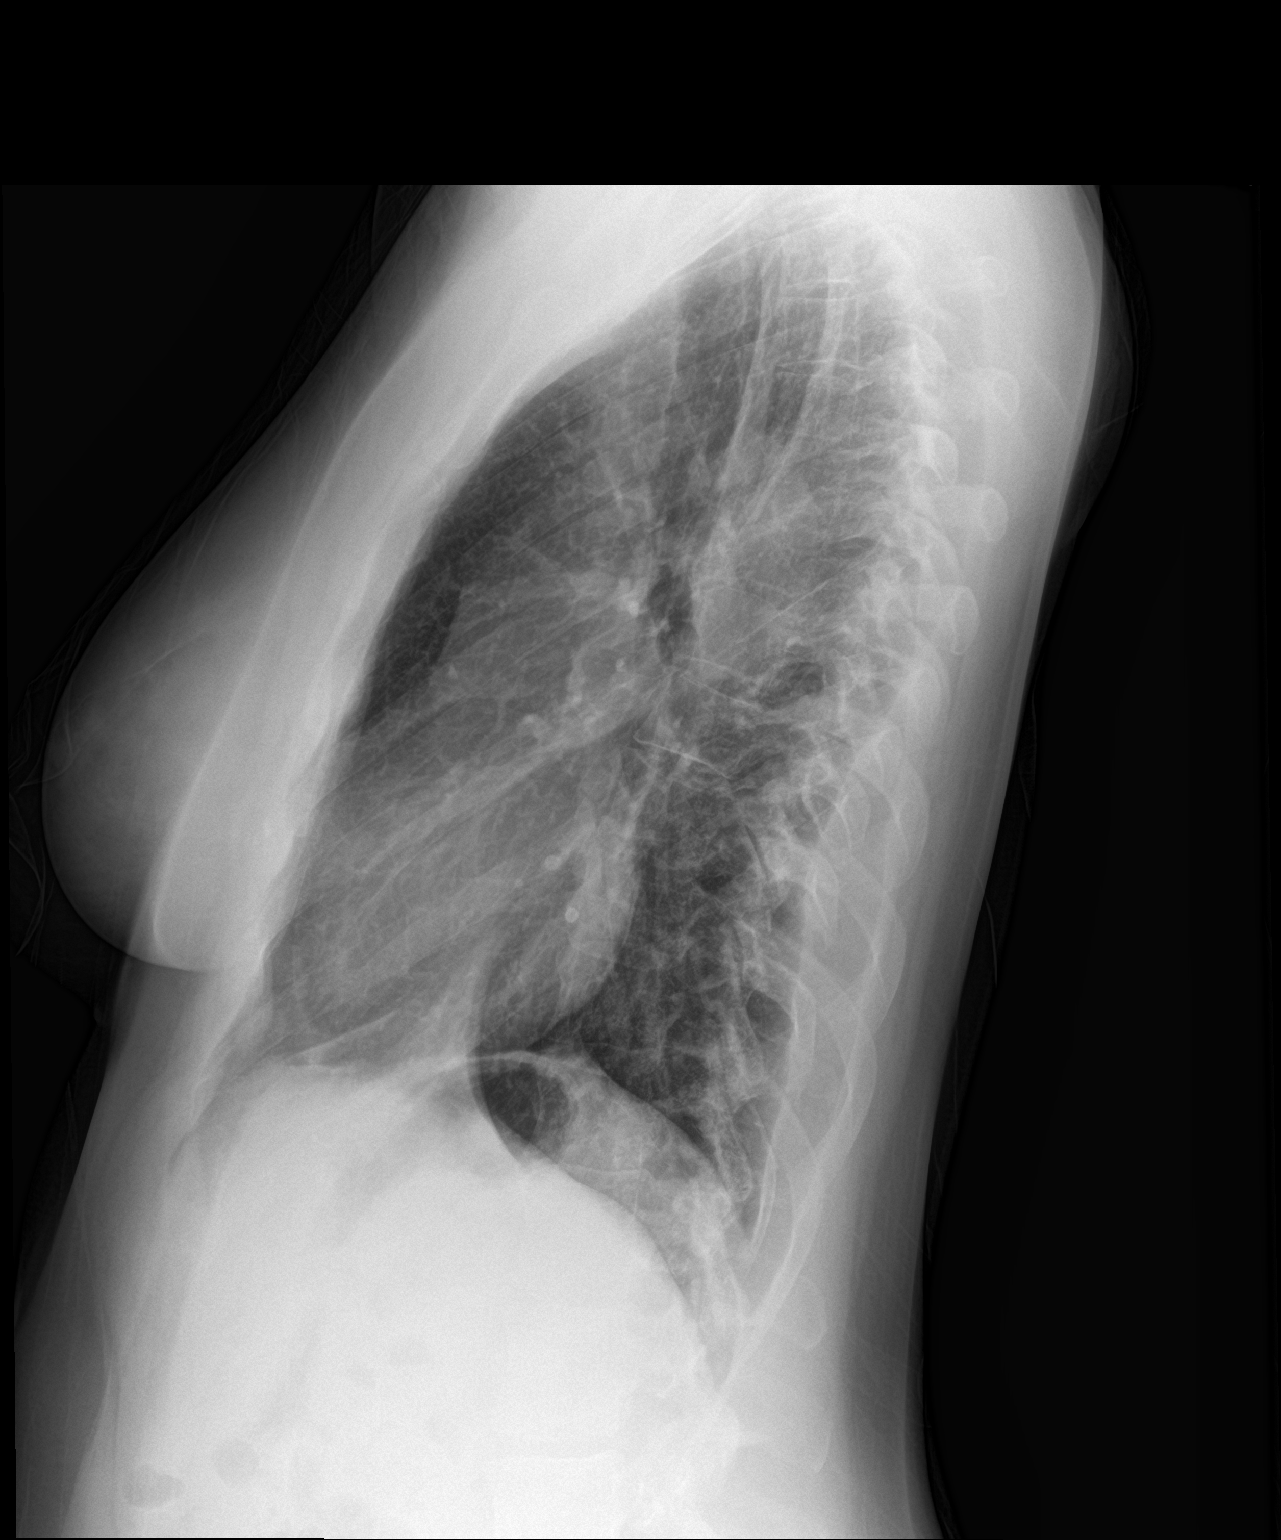

[2 of 2 positions shown; findings below may reference images not displayed]

FINDINGS: Thoracolumbar scoliosis. Mild biapical subpleural reticulation,
usually from scarring. No air bronchogram, edema, effusion, or
pneumothorax. Normal heart size and mediastinal contours.
IMPRESSION: 1. No evidence of active disease.
2. Mild biapical pleural based scarring. No generalized interstitial
opacity or fibrotic change.
3. Thoracolumbar scoliosis.

## 2019-09-05 ENCOUNTER — Telehealth: Payer: Self-pay | Admitting: Rheumatology

## 2019-09-05 ENCOUNTER — Telehealth: Payer: Self-pay

## 2019-09-05 DIAGNOSIS — K5792 Diverticulitis of intestine, part unspecified, without perforation or abscess without bleeding: Secondary | ICD-10-CM | POA: Insufficient documentation

## 2019-09-05 MED ORDER — ENBREL SURECLICK 50 MG/ML ~~LOC~~ SOAJ: SUBCUTANEOUS | 0 refills | Status: DC

## 2019-09-05 NOTE — Telephone Encounter (Signed)
Patient called stating she received a message from [facility] stating her prescription for Enbrel is on hold due to needing a new prescription from the doctor.  Patient is requesting a return call.

## 2019-09-05 NOTE — Telephone Encounter (Signed)
Last Visit: 06/25/2019 telemedicine  Next Visit: 09/26/2019  Labs: 07/14/2019 glucose 105 TB Gold: 07/14/2019 negative   Okay to refill per Dr. Corliss Skains.

## 2019-09-05 NOTE — Addendum Note (Signed)
Addended by: Ellen Henri on: 09/05/2019 03:16 PM   Modules accepted: Orders

## 2019-09-05 NOTE — Telephone Encounter (Signed)
Patient reports new diagnosis of diverticulitis and was placed on two rounds of antibiotics. Patient reports she held enbrel while on antibiotics. She is now seeing GI at [facility].   Patient has rheumatoid arthritis and currently takes enbrel sureclick 50mg  every 7 days.

## 2019-09-05 NOTE — Telephone Encounter (Signed)
Spoke with patient and advised patient she is over due to update labs. We need updated labs prior to sending a new prescription. Patient states she had labs in February 2021 at her PCP's office and will call to have those results faxed to our office.

## 2019-09-05 NOTE — Telephone Encounter (Signed)
Labs received via fax from PCP.   07/14/2019  CBC, CMP, TB gold  Glucose 105, all other labs WNL.  Tb Gold negative.   Labs reviewed by [facility]. Will send document to scan center.

## 2019-09-05 NOTE — Telephone Encounter (Signed)
Advised patient to hold Enbrel until her symptoms have completely resolved and she has completed the course of antibiotics. Patient verbalized understanding.  Added diverticulitis to problem list.

## 2019-09-05 NOTE — Telephone Encounter (Signed)
Please advise patient to hold Enbrel until her symptoms have completely resolved and she has completed the course of antibiotics.   Please add the diagnosis of diverticulitis to her problem list.

## 2019-09-20 NOTE — Progress Notes (Signed)
Office Visit Note  Patient: Michelle Pierce             Date of Birth: 1971-03-25           MRN: [id number]             PCP: Hal Morales, NP Referring: Hal Morales, NP Visit Date: 09/26/2019 Occupation: @GUAROCC @  Subjective:  Pain in both hands   History of Present Illness: Michelle Pierce is a 49 y.o. female with history of seronegative rheumatoid arthritis and osteoarthritis.  She states she has had several interruptions her Enbrel injections over the past 2 months.  She states she had 1 dose in March, 1 dose in April, and her most recent dose was on 09/24/19. She states on 08/17/19 she was diagnosed with diverticulitis and was prescribed Flagyl and Cipro.  She held enbrel while taking these medications.  She also held enbrel around the time of receiving both covid-19 infections. She has been having increased pain in both hands but denies any joint swelling. 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   She has developed a callus underneath her right 5th MTP joint.  She plans on following up with a podiatrist.   Activities of Daily Living:  Patient reports morning stiffness for 0 minutes.   Patient Reports nocturnal pain.  Difficulty dressing/grooming: Denies Difficulty climbing stairs: Denies Difficulty getting out of chair: Denies Difficulty using hands for taps, buttons, cutlery, and/or writing: Reports  Review of Systems  Constitutional: Positive for fatigue.  HENT: Positive for mouth dryness. Negative for mouth sores and nose dryness.   Eyes: Positive for dryness. Negative for pain, itching and visual disturbance.  Respiratory: Negative for cough, hemoptysis, shortness of breath and difficulty breathing.   Cardiovascular: Negative for chest pain, palpitations, hypertension and swelling in legs/feet.  Gastrointestinal: Negative for blood in stool, constipation and diarrhea.  Endocrine: Negative for  increased urination.  Genitourinary: Negative for difficulty urinating and painful urination.  Musculoskeletal: Positive for arthralgias, joint pain, myalgias, muscle tenderness and myalgias. Negative for joint swelling, muscle weakness and morning stiffness.  Skin: Negative for color change, pallor, rash, hair loss, nodules/bumps, redness, skin tightness, ulcers and sensitivity to sunlight.  Allergic/Immunologic: Negative for susceptible to infections.  Neurological: Positive for headaches. Negative for dizziness, numbness, memory loss and weakness.  Hematological: Negative for bruising/bleeding tendency and swollen glands.  Psychiatric/Behavioral: Negative for depressed mood, confusion and sleep disturbance. The patient is not nervous/anxious.     PMFS History:  Patient Active Problem List   Diagnosis Date Noted  . Diverticulitis 09/05/2019  . Juvenile rheumatoid arthritis (HCC) 12/27/2018  . Anxiety disorder 07/27/2018  . 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 Attention deficit hyperactivity disorder (ADHD), predominantly inattentive type 07/27/2018  . Malaise and fatigue 07/27/2018  . Overweight 07/27/2018  . Dyspnea 02/06/2018  . Stress at home 02/06/2018  . Primary osteoarthritis of both hips 12/30/2016  . History of scoliosis 12/30/2016  . Elevated LFTs 12/30/2016  . Sicca syndrome, unspecified (HCC) 09/02/2016  . Rheumatoid arthritis of multiple sites without rheumatoid factor (HCC) 04/08/2016  . High risk medication use 04/08/2016  . Osteoarthritis, 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    Past Medical History:  Diagnosis Date  . Diverticulitis    per patient   . Rheumatoid arthritis (HCC)     Family History  Problem Relation Age of Onset  . Hypertension Mother   . Cancer Father   .  Sleep apnea Sister   . Ulcerative colitis Son    Past Surgical History:  Procedure Laterality Date  . KNEE ARTHROSCOPY Bilateral    Social History   Social History Narrative  . Not on file    Immunization History  Administered Date(s) Administered  . Influenza-Unspecified 03/07/2014, 02/24/2015, 02/22/2016     Objective: Vital Signs: BP 122/79 (BP Location: Left Arm, Patient Position: Sitting, Cuff Size: Normal)   Pulse 73   Resp 14   Ht 5\' 6"  (1.676 m)   Wt 165 lb 12.8 oz (75.2 kg)   BMI 26.76 kg/m    Physical Exam Vitals and nursing note reviewed.  Constitutional:      Appearance: She is well-developed.  HENT:     Head: Normocephalic and atraumatic.  Eyes:     Conjunctiva/sclera: Conjunctivae normal.  Pulmonary:     Effort: Pulmonary effort is normal.  Abdominal:     General: Bowel sounds are normal.     Palpations: Abdomen is soft.  Musculoskeletal:     Cervical back: Normal range of motion.  Lymphadenopathy:     Cervical: No cervical adenopathy.  Skin:    General: Skin is warm and dry.     Capillary Refill: Capillary refill takes less than 2 seconds.  Neurological:     Mental Status: She is alert and oriented to person, place, and time.  Psychiatric:        Behavior: Behavior normal.      Musculoskeletal Exam: C-spine, thoracic spine, and lumbar spine good ROM. No midline spinal tenderness.  No SI joint tenderness.  Shoulder joints, elbow joints, wrist joints, MCPs, PIPs, and DIPs good ROM with no synovitis.  Complete fist formation bilaterally.  Hip joints, knee joints, ankle joints, MTPs, PIPs, and DIPs good ROM with no synovitis.  No warmth or effusion of knee joints.  No tenderness or swelling of ankle joints.    CDAI Exam: CDAI Score: 0.6  Patient Global: 3 mm; Provider Global: 3 mm Swollen: 0 ; Tender: 0  Joint Exam 09/26/2019   No joint exam has been documented for this visit   There is currently no information documented on the homunculus. Go to the Rheumatology activity and complete the homunculus joint exam.  Investigation: No additional findings.  Imaging: No results found.  Recent Labs: Lab Results  Component Value Date    WBC 8.0 01/22/2019   HGB 13.1 01/22/2019   PLT 300 01/22/2019   NA 140 01/22/2019   K 4.2 01/22/2019   CL 103 01/22/2019   CO2 30 01/22/2019   GLUCOSE 84 01/22/2019   BUN 7 01/22/2019   CREATININE 0.60 01/22/2019   BILITOT 0.6 01/22/2019   AST 24 01/22/2019   ALT 26 01/22/2019   PROT 6.7 01/22/2019   CALCIUM 9.5 01/22/2019   GFRAA 127 01/22/2019    Speciality Comments: No specialty comments available.  Procedures:  No procedures performed Allergies: Sulfa antibiotics, Factive [gemifloxacin], and Penicillins     Assessment / Plan:     Visit Diagnoses: Rheumatoid arthritis of multiple sites without rheumatoid factor ([place]): She has no synovitis or tenderness on exam.  She has been experiencing increased pain in both hands but no tenderness or inflammation was noted.  She has complete fist formation bilaterally.  PIP and DIP thickening noted consistent with osteoarthritis of both hands. She is prescribed Enbrel 50 mg sq injections every 7 days.  Her last injection was on 09/24/19. She has had several interruptions in Enbrel due to being  diagnosed with diverticulitis on 08/17/19 and then hold enbrel around the time of receiving both covid-19 vaccinations.  She had 1 enbrel of enbrel in March and 1 injection in April.  She was advised to continue injecting Enbrel 50 mg once weekly.  She was advised to notify us if she develops increased joint pain or joint swelling. She will follow up in 5 months.   High risk medication use -  Enbrel SureClick 50 mg every 7 days.  Methotrexate discontinued due to elevated LFTs in June 2019.  TB gold negative on 07/14/19.  CBC and CMP were drawn on 08/17/19.  She will return for lab work in June and every 3 months.  Standing orders are in place.    Primary osteoarthritis of both hands: She has PIP and DIP thickening consistent with osteoarthritis of both hands.  No tenderness or synovitis.  Joint protection and muscle strengthening were discussed.    Primary  osteoarthritis of both hips: She has good ROM of both hip joints with no discomfort.   Sicca syndrome (HCC): She has chronic sicca symptoms.  Her symptoms have been tolerable.   History of scoliosis: She is not having any discomfort at this time.   Elevated LFTs: AST 81 and ALT 94 on 08/17/19 while in the ED.  She was diagnosed with diverticulitis and prescribed cipro and flagyl.   Paresthesia of left upper extremity: Resolved   History of diverticulitis - Diagnosed 08/17/19.  She completed a course of cipro and flagyl.  She held enbrel while taking these medications.  She has not had any recurrence or symptoms.    Orders: No orders of the defined types were placed in this encounter.  No orders of the defined types were placed in this encounter.   Follow-Up Instructions: Return in about 5 months (around 02/26/2020) for Rheumatoid arthritis, Osteoarthritis.   Sherron Ales, PA-C  I examined and evaluated the patient with Sherron Ales PA.  Patient had interruption in Enbrel treatment due to Covid vaccine.  She has been experiencing some stiffness in her hands.  I did not notice any synovitis.  She does have underlying osteoarthritis.  We will continue current treatment.  The plan of care was discussed as noted above.  Pollyann Savoy, MD  Note - This record has been created using Animal nutritionist.  Chart creation errors have been sought, but may not always  have been located. Such creation errors do not reflect on  the standard of medical care.

## 2019-09-26 ENCOUNTER — Ambulatory Visit (INDEPENDENT_AMBULATORY_CARE_PROVIDER_SITE_OTHER): Payer: Managed Care, Other (non HMO) | Admitting: Rheumatology

## 2019-09-26 ENCOUNTER — Other Ambulatory Visit: Payer: Self-pay

## 2019-09-26 ENCOUNTER — Encounter: Payer: Self-pay | Admitting: Physician Assistant

## 2019-09-26 VITALS — BP 122/79 | HR 73 | Resp 14 | Ht 66.0 in | Wt 165.8 lb

## 2019-09-26 DIAGNOSIS — M19041 Primary osteoarthritis, right hand: Secondary | ICD-10-CM | POA: Diagnosis not present

## 2019-09-26 DIAGNOSIS — M0609 Rheumatoid arthritis without rheumatoid factor, multiple sites: Secondary | ICD-10-CM | POA: Diagnosis not present

## 2019-09-26 DIAGNOSIS — R202 Paresthesia of skin: Secondary | ICD-10-CM

## 2019-09-26 DIAGNOSIS — M160 Bilateral primary osteoarthritis of hip: Secondary | ICD-10-CM | POA: Diagnosis not present

## 2019-09-26 DIAGNOSIS — M3500 Sicca syndrome, unspecified: Secondary | ICD-10-CM

## 2019-09-26 DIAGNOSIS — Z8719 Personal history of other diseases of the digestive system: Secondary | ICD-10-CM

## 2019-09-26 DIAGNOSIS — R7989 Other specified abnormal findings of blood chemistry: Secondary | ICD-10-CM

## 2019-09-26 DIAGNOSIS — Z79899 Other long term (current) drug therapy: Secondary | ICD-10-CM

## 2019-09-26 DIAGNOSIS — Z8739 Personal history of other diseases of the musculoskeletal system and connective tissue: Secondary | ICD-10-CM

## 2019-09-26 DIAGNOSIS — M19042 Primary osteoarthritis, left hand: Secondary | ICD-10-CM

## 2019-09-26 NOTE — Patient Instructions (Addendum)
Standing Labs We placed an order today for your standing lab work.    Please come back and get your standing labs in June and every 3 months   We have open lab daily Monday through Thursday from 8:30-12:30 PM and 1:30-4:30 PM and Friday from 8:30-12:30 PM and 1:30-4:00 PM at the office of Dr. Shaili Deveshwar.   You may experience shorter wait times on Monday and Friday afternoons. The office is located at [address], [address], [place], [place] [address] No appointment is necessary.   Labs are drawn by [facility].  You may receive a bill from [facility] for your lab work.  If you wish to have your labs drawn at another location, please call the office 24 hours in advance to send orders.  If you have any questions regarding directions or hours of operation,  please call [phone].   Just as a reminder please drink plenty of water prior to coming for your lab work. Thanks!   

## 2019-10-19 HISTORY — PX: COLONOSCOPY: SHX174

## 2019-10-29 ENCOUNTER — Telehealth: Payer: Self-pay | Admitting: Rheumatology

## 2019-10-29 NOTE — Telephone Encounter (Signed)
Attempted to contact the patient and left message for patient to call the office.  

## 2019-10-29 NOTE — Telephone Encounter (Signed)
Patient left a voicemail stating "she tried to fill her prescription and it was denied."  Patient requested a return call.

## 2019-11-06 ENCOUNTER — Telehealth: Payer: Self-pay | Admitting: Rheumatology

## 2019-11-06 NOTE — Telephone Encounter (Signed)
Patient called requesting her labwork orders be sent to [facility] in [place].  Patient is planning to go this week.

## 2019-11-07 ENCOUNTER — Other Ambulatory Visit: Payer: Self-pay | Admitting: *Deleted

## 2019-11-07 DIAGNOSIS — Z79899 Other long term (current) drug therapy: Secondary | ICD-10-CM

## 2019-11-07 NOTE — Telephone Encounter (Signed)
Lab Orders faxed

## 2019-12-12 ENCOUNTER — Other Ambulatory Visit: Payer: Self-pay | Admitting: Rheumatology

## 2019-12-12 NOTE — Telephone Encounter (Addendum)
Last Visit: 09/26/2019 Next Visit: 02/27/2020  Labs: 07/14/2019 Glucose 105, all other labs WNL.  Tb Gold: 07/14/2019 Neg   Current Dose per office note on 09/26/2019: Enbrel SureClick 50 mg every 7 days  Patient advised she is due to update labs. Patient will try to go to PCP and have labs performed today.  Okay to refill 30 day supply Enbrel?

## 2019-12-12 NOTE — Telephone Encounter (Signed)
Please wait to send refill until we have reviewed updated lab results tomorrow.

## 2019-12-26 ENCOUNTER — Telehealth: Payer: Self-pay | Admitting: Rheumatology

## 2019-12-26 NOTE — Telephone Encounter (Signed)
Patient advised we currently do not have a copy of her lab results to review. Patient advised to have her PCP fax  Over the labs and then the providers could review the labs and make their recommendations. Patient provided with fax number and will have PCP send them to the office.

## 2019-12-26 NOTE — Telephone Encounter (Signed)
Patient left a message requesting a call back to discuss her increased Liver Enzymes. Patient states lab report shows that level elevated, and she would like to discuss this.

## 2019-12-27 ENCOUNTER — Telehealth: Payer: Self-pay | Admitting: *Deleted

## 2019-12-27 NOTE — Telephone Encounter (Signed)
She had updated lab work today on 12/27/19, AST 52 and ALT 91.  She is undergoing a thorough workup at [facility].

## 2019-12-27 NOTE — Telephone Encounter (Signed)
Received lab results from patient's PCP. Reviewed by Hazel Sams, PA-C Drawn on 12/25/2019 (In Care Everywhere)  BUN: 7 Alk phos: 249 AST:77 ALT: 108 WBC 11.6 Vitamin B12 >1,500 Urine Blood trace  Sed rate 57  Patient is on Enbrel 50 mg every 7 days.   Recommendation: Please call patient to clarify if she has started on any new meds. Please advise patient to avoid NSAIDS, tylenol and alcohol use.  Spoke with patient who states she is being admitted to [facility] for her Diverticulitis. Patient states they performed a CT which showed a 3 inch abscess. Patient is to be started on IV antibiotics. Patient states prior to admission she had taken Midol due to her cycle and tylenol for a few days. Patient states when she saw her PCP she was given antibiotics and Oxycodone which she took one dose of.

## 2020-01-02 ENCOUNTER — Telehealth: Payer: Self-pay | Admitting: Rheumatology

## 2020-01-02 ENCOUNTER — Telehealth: Payer: Self-pay | Admitting: Gastroenterology

## 2020-01-02 NOTE — Telephone Encounter (Signed)
Patient states she has been discharge from the hospital. Patient states her last Enbrel injection on 12/17/2019. Patient states she is currently on antibiotics and will be for a total of 14 days. Patient wants to know when she can restart to the Enbrel. Patient advised to wait until she has completed her antibiotics and has been cleared by GI or her PCP. Patient verbalized understanding.

## 2020-01-02 NOTE — Telephone Encounter (Signed)
Hey Dr. Chales Abrahams, this patient is wanting to transfer her care to you from [facility]. She was seen there a couple of months ago. She states that after she seen them this year that she has been hospitalized recently because of diverticulitis and does not feel like she got any help. She states people in her family are your patients and would like you as her doctor. Please advise if you will accept patient. Thank you!

## 2020-01-02 NOTE — Telephone Encounter (Signed)
Okay to schedule with me or Colleen whichever is faster RG

## 2020-01-02 NOTE — Telephone Encounter (Signed)
Patient called stating she has questions regarding her Enbrel medication.  Patient states she took her last injection on 12/17/19 and was in the hospital from 12/26/19 to 12/28/19 for Diverticulitis flair-up.  Patient states she was prescribed Cipro 2 times/day and Flagyl (3 times/day) for 14 days.  Patient is requesting a return call.

## 2020-01-03 ENCOUNTER — Encounter: Payer: Self-pay | Admitting: Gastroenterology

## 2020-01-03 NOTE — Telephone Encounter (Signed)
Patient scheduled for 10/15.

## 2020-01-24 ENCOUNTER — Ambulatory Visit: Payer: Managed Care, Other (non HMO) | Admitting: Gastroenterology

## 2020-01-24 ENCOUNTER — Encounter: Payer: Self-pay | Admitting: Gastroenterology

## 2020-01-24 VITALS — BP 120/72 | HR 77 | Ht 64.0 in | Wt 165.1 lb

## 2020-01-24 DIAGNOSIS — K5732 Diverticulitis of large intestine without perforation or abscess without bleeding: Secondary | ICD-10-CM | POA: Diagnosis not present

## 2020-01-24 NOTE — Patient Instructions (Signed)
If you are age 47 or older, your body mass index should be between 23-30. Your Body mass index is 28.34 kg/m. If this is out of the aforementioned range listed, please consider follow up with your Primary Care Provider.  If you are age 46 or younger, your body mass index should be between 19-25. Your Body mass index is 28.34 kg/m. If this is out of the aformentioned range listed, please consider follow up with your Primary Care Provider.   Please purchase the following medications over the counter and take as directed: Benefiber 1 teaspoon once daily.   Avoid NSAIDS.   Walk 30 minutes daily.   May resume Enbrel.  Thank you,  Dr. Lynann Bologna

## 2020-01-24 NOTE — Progress Notes (Signed)
Chief Complaint:   Referring Provider:  Hal Morales, NP      ASSESSMENT AND PLAN;   #1. Recurrent Acute sigmoid diverticulitis 07/2019, 12/2019. Colon 09/2019 neg except for mild pancolonic diverticulosis.  #2. RA on Enbrel  Plan: -Benefiber 1 tsp po qd with 8oz water. -Avoid NSAIDs -Walking 30 min/day. -Since she feels significantly better would hold off on repeat CT Abdo/pelvis at this time.  We have discussed extensively regarding medical and surgical treatment for diverticulitis.  At the present time she is opted for conservative management.  I do agree. -FU in 6 months -OK to to resume enbrel.  Ever since she stopped Enbrel, she is having increasing joint pains/swelling.   HPI:    Michelle Pierce is a 49 y.o. female  Had 2 attacks of sigmoid diverticulitis 08/17/2019 confirmed on CT Abdo/pelvis requiring Cipro and Flagyl.  She did have good improvement thereafter.  Underwent colonoscopy on 10/19/2019 at [facility] which showed mild pancolonic diverticulosis.  Recommended to repeat colon in 10 years.  Unfortunately she had another attack of diverticulitis 12/27/2019 with 2.9 cm diverticular abscess.  She required hospitalization with IV antibiotics followed by p.o. Cipro and Flagyl.  She feels much better and now has recovered completely.  Denies having any nausea, vomiting, heartburn, regurgitation, weight loss or any abdominal pain.  No melena or hematochezia.  Currently having 2-3 BMs/day, taking high-fiber diet.  Several family members had diverticulosis.  Her great aunt apparently required Hartman's procedure.   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  Past Medical History:  Diagnosis Date  . Diverticulitis    per patient   . Rheumatoid arthritis [facility]     Past Surgical History:  Procedure Laterality Date  . COLONOSCOPY  10/19/2019   [facility]. Mild diverticulosis was noted throughout the entire examined colon. The colon musoca  was otherwise normal. Retroflexed views revealed internal Grade II second degree hemorrhoids.   Marland Kitchen KNEE ARTHROSCOPY Bilateral     Family History  Problem Relation Age of Onset  . Hypertension Mother   . Irritable bowel syndrome Mother   . Stomach cancer Father   . Lung cancer Father   . Diverticulitis Father   . Sleep apnea Sister   . Ulcerative colitis Son   . Cancer Paternal Grandmother   . Diverticulitis Paternal Grandmother   . Hodgkin's lymphoma Paternal Grandmother   . Prostate cancer Other        Aunts grandfather on dad side   . Bone cancer Other   . Diverticulitis Paternal Aunt        had colon surgery and colostomy     Social History   Tobacco Use  . Smoking status: Never Smoker  . Smokeless tobacco: Never Used  Vaping Use  . Vaping Use: Never used  Substance Use Topics  . Alcohol use: No  . Drug use: No    Current Outpatient Medications  Medication Sig Dispense Refill  . Cyanocobalamin (VITAMIN B-12 PO) Take 1 tablet by mouth daily.    . cycloSPORINE (RESTASIS) 0.05 % ophthalmic emulsion Place 1 drop into both eyes daily.    Marland Kitchen etanercept (ENBREL SURECLICK) 50 MG/ML injection INJECT 1 PEN UNDER THE SKIN EVERY 7 DAYS. 12 mL 0  . folic acid (FOLVITE) 1 MG tablet TAKE TWO TABLETS BY MOUTH EVERY DAY 180 tablet 3  . levocetirizine (XYZAL) 5 MG tablet Take 5 mg by mouth as needed.   0  . lisdexamfetamine (  VYVANSE) 70 MG capsule Take 70 mg by mouth daily. (Patient not taking: Reported on 01/24/2020)     No current facility-administered medications for this visit.    Allergies  Allergen Reactions  . Sulfa Antibiotics Other (See Comments)    Elevated AST/ALT  . Factive [Gemifloxacin] Hives  . Penicillins Hives    Review of Systems:  Constitutional: Denies fever, chills, diaphoresis, appetite change and has fatigue.  HEENT: Denies photophobia, eye pain, redness, hearing loss, ear pain, congestion, sore throat, rhinorrhea, sneezing, mouth sores, neck pain, neck  stiffness and tinnitus.   Respiratory: Denies SOB, DOE, cough, chest tightness,  and wheezing.   Cardiovascular: Denies chest pain, palpitations and leg swelling.  Genitourinary: Denies dysuria, urgency, frequency, hematuria, flank pain and difficulty urinating.  Musculoskeletal: Denies myalgias, back pain, joint swelling, arthralgias and gait problem.  Has rheumatoid arthritis. Skin: No rash.  Neurological: Denies dizziness, seizures, syncope, weakness, light-headedness, numbness and headaches.  Hematological: Denies adenopathy. Easy bruising, personal or family bleeding history  Psychiatric/Behavioral: No anxiety or depression     Physical Exam:    BP 120/72   Pulse 77   Ht 5\' 4"  (1.626 m)   Wt 165 lb 2 oz (74.9 kg)   BMI 28.34 kg/m  Wt Readings from Last 3 Encounters:  01/24/20 165 lb 2 oz (74.9 kg)  09/26/19 165 lb 12.8 oz (75.2 kg)  01/22/19 172 lb 12.8 oz (78.4 kg)   Constitutional:  Well-developed, in no acute distress. Psychiatric: Normal mood and affect. Behavior is normal. HEENT: Pupils normal.  Conjunctivae are normal. No scleral icterus. Neck supple.  Cardiovascular: Normal rate, regular rhythm. No edema Pulmonary/chest: Effort normal and breath sounds normal. No wheezing, rales or rhonchi. Abdominal: Soft, nondistended. Nontender. Bowel sounds active throughout. There are no masses palpable. No hepatomegaly. Rectal:  defered Neurological: Alert and oriented to person place and time. Skin: Skin is warm and dry. No rashes noted.  Data Reviewed: I have personally reviewed following labs and imaging studies  CBC: CBC Latest Ref Rng & Units 01/22/2019 11/16/2017  WBC 3.8 - 10.8 Thousand/uL 8.0 6.0  Hemoglobin 11.7 - 15.5 g/dL 11/18/2017 98.3  Hematocrit 35 - 45 % 39.1 39.4  Platelets 140 - 400 Thousand/uL 300 311    CMP: CMP Latest Ref Rng & Units 01/22/2019 11/16/2017  Glucose 65 - 99 mg/dL 84 97  BUN 7 - 25 mg/dL 7 9  Creatinine 11/18/2017 - 1.10 mg/dL 5.05 3.97  Sodium  6.73 - 146 mmol/L 140 141  Potassium 3.5 - 5.3 mmol/L 4.2 4.9  Chloride 98 - 110 mmol/L 103 105  CO2 20 - 32 mmol/L 30 29  Calcium 8.6 - 10.2 mg/dL 9.5 9.4  Total Protein 6.1 - 8.1 g/dL 6.7 7.3  Total Bilirubin 0.2 - 1.2 mg/dL 0.6 0.6  AST 10 - 35 U/L 24 118(H)  ALT 6 - 29 U/L 26 152(H)      419, MD 01/24/2020, 10:59 AM  Cc: 03/25/2020, NP

## 2020-02-01 ENCOUNTER — Other Ambulatory Visit: Payer: Self-pay | Admitting: Rheumatology

## 2020-02-01 NOTE — Telephone Encounter (Signed)
Last Visit: 09/26/2019 Next Visit: 02/27/2020   Okay to refill per Dr. Corliss Skains

## 2020-02-04 NOTE — Telephone Encounter (Signed)
Last Visit: 09/26/2019 Next Visit: 02/27/2020  Labs: 12/27/2019 12/27/19, AST 52 and ALT 91.  TB Gold: 07/14/2019 Neg   Current Dose per office note 09/26/2019: Enbrel SureClick 50 mg every 7 days  DX: Rheumatoid arthritis of multiple sites without rheumatoid factor  Okay to refill Enbrel?

## 2020-02-13 NOTE — Progress Notes (Signed)
Office Visit Note  Patient: Michelle CopaKimberly D Arlen             Date of Birth: 09-Jul-1970           MRN: [id number]             PCP: Gordan PaymentGrisso, Greg A., MD Referring: Hal MoralesGunter, Tara G, NP Visit Date: 02/27/2020 Occupation: @GUAROCC @  Subjective:  Recurrent diverticulitis.  History of Present Illness: Michelle Pierce is a 49 y.o. female with history of seronegative rheumatoid arthritis and osteoarthritis.  She is prescribed enbrel 50 mg sq injections once weekly.  Patient reports that in March 2021 she was diagnosed with diverticulitis and was treated with Flagyl and Cipro at that time.  She states that she had a recurrence in August 2021 and was hospitalized during that time.  She states that over the past several days she has started to have symptoms of a diverticulitis flare.  She is experiencing left lower quadrant abdominal pain and loss of appetite.  According to the patient she had some leftover Cipro and Flagyl at home and started taking both of these medications on Saturday.  Her symptoms have slowly started to improve.  She called Dr. Chales AbrahamsGupta who was sent in 2 new prescriptions to the pharmacy.  She is scheduled for an abdominal CT on 2020-03-03.  Patient reports that she has had several interruptions of Enbrel due to these flares.  She is due for Enbrel on Sunday but held that dose since she was taking Cipro and Flagyl.  She states that while off of Enbrel she has had some increased joint pain and joint stiffness especially in her hands and wrist joints.  She denies any joint swelling at this time.   Activities of Daily Living:  Patient reports morning stiffness for 0 minutes.   Patient Denies nocturnal pain.  Difficulty dressing/grooming: Denies Difficulty climbing stairs: Denies Difficulty getting out of chair: Denies Difficulty using hands for taps, buttons, cutlery, and/or writing: Denies  Review of Systems  Constitutional: Positive for fatigue.  HENT: Positive for mouth dryness.  Negative for mouth sores and nose dryness.   Eyes: Positive for dryness. Negative for pain and visual disturbance.  Respiratory: Negative for cough, hemoptysis, shortness of breath and difficulty breathing.   Cardiovascular: Negative for chest pain, palpitations, hypertension and swelling in legs/feet.  Gastrointestinal: Negative for blood in stool, constipation and diarrhea.  Endocrine: Negative for increased urination.  Genitourinary: Negative for painful urination.  Musculoskeletal: Negative for arthralgias, joint pain, joint swelling, myalgias, muscle weakness, morning stiffness, muscle tenderness and myalgias.  Skin: Positive for hair loss. Negative for color change, pallor, rash, nodules/bumps, skin tightness, ulcers and sensitivity to sunlight.  Allergic/Immunologic: Positive for susceptible to infections.  Neurological: Negative for dizziness, numbness, headaches and weakness.  Hematological: Negative for swollen glands.  Psychiatric/Behavioral: Negative for depressed mood and sleep disturbance. The patient is not nervous/anxious.     PMFS History:  Patient Active Problem List   Diagnosis Date Noted  . Diverticulitis 09/05/2019  . Juvenile rheumatoid arthritis (HCC) 12/27/2018  . Anxiety disorder 07/27/2018  . 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 Attention deficit hyperactivity disorder (ADHD), predominantly inattentive type 07/27/2018  . Malaise and fatigue 07/27/2018  . Overweight 07/27/2018  . Dyspnea 02/06/2018  . Stress at home 02/06/2018  . Primary osteoarthritis of both hips 12/30/2016  . History of scoliosis 12/30/2016  . Elevated LFTs 12/30/2016  . Sicca syndrome, unspecified 09/02/2016  . Rheumatoid arthritis of multiple sites without rheumatoid factor (  HCC) 04/08/2016  . High risk medication use 04/08/2016  . Osteoarthritis, 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    Past Medical History:  Diagnosis Date  . Diverticulitis    per patient   . Rheumatoid arthritis (HCC)     Family  History  Problem Relation Age of Onset  . Hypertension Mother   . Irritable bowel syndrome Mother   . Stomach cancer Father   . Lung cancer Father   . Diverticulitis Father   . Sleep apnea Sister   . Ulcerative colitis Son   . Cancer Paternal Grandmother   . Diverticulitis Paternal Grandmother   . Hodgkin's lymphoma Paternal Grandmother   . Prostate cancer Other        Aunts grandfather on dad side   . Bone cancer Other   . Diverticulitis Paternal Aunt        had colon surgery and colostomy    Past Surgical History:  Procedure Laterality Date  . COLONOSCOPY  10/19/2019   [facility]. Mild diverticulosis was noted throughout the entire examined colon. The colon musoca was otherwise normal. Retroflexed views revealed internal Grade II second degree hemorrhoids.   [facility] KNEE ARTHROSCOPY Bilateral    Social History   Social History Narrative  . Not on file   Immunization History  Administered Date(s) Administered  . Influenza-Unspecified 03/07/2014, 02/24/2015, 02/22/2016  . PFIZER SARS-COV-2 Vaccination 08/16/2019, 09/10/2019     Objective: Vital Signs: BP 115/74 (BP Location: Left Arm, Patient Position: Sitting, Cuff Size: Small)   Pulse 65   Ht 5\' 4"  (1.626 m)   Wt 161 lb 12.8 oz (73.4 kg)   LMP 02/01/2020 (Within Days)   BMI 27.77 kg/m    Physical Exam Vitals and nursing note reviewed.  Constitutional:      Appearance: She is well-developed.  HENT:     Head: Normocephalic and atraumatic.  Eyes:     Conjunctiva/sclera: Conjunctivae normal.  Pulmonary:     Effort: Pulmonary effort is normal.  Abdominal:     Palpations: Abdomen is soft.  Musculoskeletal:     Cervical back: Normal range of motion.  Skin:    General: Skin is warm and dry.     Capillary Refill: Capillary refill takes less than 2 seconds.  Neurological:     Mental Status: She is alert and oriented to person, place, and time.  Psychiatric:        Behavior: Behavior normal.      Musculoskeletal  Exam: C-spine, thoracic spine, lumbar spine have good range of motion with no discomfort.  Shoulder joints and elbow joints have good range of motion with no discomfort.  No tenderness or inflammation of elbow joints noted.  Limited range of motion of both wrist joints especially the left wrist. No tenderness or synovitis of wrist joints noted. MCPs, PIPs, and DIPs have good range of motion with no synovitis.  She is able to make a complete fist bilaterally.  Hip joints have good range of motion with no discomfort at this time.  Knee joints have good range of motion with no warmth or effusion.  Ankle joints have good range of motion with no tenderness or inflammation.  CDAI Exam: CDAI Score: 0.6  Patient Global: 4 mm; Provider Global: 2 mm Swollen: 0 ; Tender: 0  Joint Exam 02/27/2020   No joint exam has been documented for this visit   There is currently no information documented on the homunculus. Go to the Rheumatology activity and complete the homunculus joint exam.  Investigation: No  additional findings.  Imaging: No results found.  Recent Labs: Lab Results  Component Value Date   WBC 8.0 01/22/2019   HGB 13.1 01/22/2019   PLT 300 01/22/2019   NA 140 01/22/2019   K 4.2 01/22/2019   CL 103 01/22/2019   CO2 30 01/22/2019   GLUCOSE 84 01/22/2019   BUN 7 01/22/2019   CREATININE 0.60 01/22/2019   BILITOT 0.6 01/22/2019   AST 24 01/22/2019   ALT 26 01/22/2019   PROT 6.7 01/22/2019   CALCIUM 9.5 01/22/2019   GFRAA 127 01/22/2019    Speciality Comments: No specialty comments available.  Procedures:  No procedures performed Allergies: Sulfa antibiotics, Factive [gemifloxacin], and Penicillins   Assessment / Plan:     Visit Diagnoses: Rheumatoid arthritis of multiple sites without rheumatoid factor (HCC): She has no joint tenderness or synovitis on examination today.  She has been experiencing some increased discomfort and stiffness in both hands and both wrist joints.  She  has limited range of motion of both wrist joints especially the left wrist with flexion.  No tenderness or synovitis of the wrist joints were noted.  Overall she is clinically been doing well on Enbrel 50 mg subcutaneous injections every 7 days.  She was diagnosed with diverticulitis in March 2021 and had a recurrence in August 2021 at which time she was hospitalized.  According to the patient she was off of Enbrel for about 1 month during that time while being treated with Flagyl and Cipro.  She started to experience left lower quadrant pain and loss of appetite several days ago and feels as though she may be having another flare.  She started to take leftover Cipro and Flagyl that she had at home on Saturday and skipped her dose of Enbrel on Sunday.  She will be having lab work drawn today ordered by Dr. Chales Abrahams.  She is also scheduled for an abdominal CT on 2020-03-03 for further evaluation.  She was advised to hold Enbrel until she has completed this round of Flagyl and Cipro.  We discussed that anytime she has signs or symptoms of an infection she is to hold Enbrel until the infection has completely cleared.  We discussed that if she continues to have recurrent diverticulitis we may need to start spacing the dose of Enbrel to every 14 days.  She was advised to notify us if she develops increased joint pain or joint swelling while off of Enbrel.  She will follow-up in the office in 3 months to reevaluate.  High risk medication use - Enbrel SureClick 50 mg sq injections every 7 days.  Methotrexate discontinued due to elevated LFTs in June 2019.  TB gold negative on 07/14/19.  CBC and CMP will be drawn today and every 3 months to monitor for drug toxicity. She has been experiencing recurrent diverticulitis.  She was advised to hold Enbrel anytime she has an infection.  She continues to have recurrent diverticulitis we may need to space the dose of Enbrel so she is not as immunosuppressed.   She has received both  COVID-19 vaccinations and is planning on receiving a third dose in the future.  She was advised to avoid taking Tylenol and NSAIDs 24 h prior to the third dose.  She was advised to notify us or her PCP if she develops a COVID-19 infection in order to receive the antibody infusion.  She voiced understanding.  Diverticulitis: She was diagnosed with acute diverticulitis on 3/26/2 and was treated outpatient with oral  ciprofloxacin and flagyl. She had a recurrence of symptoms in August 2021 and had an abdominal CT consistent with acute diverticulitis with a small abscess and was hospitalized to receive IV flagly and IV ciprofloxacin at that time. She has started to increase her intake of water, fiber, and exercise level recently.  She has started to have a recurrence of symptoms the last several days including LLQ abdominal pain and loss of appetite.  She started to take leftover Flagyl and Cipro that she had at home on Saturday and held her dose of Enbrel on Sunday.  She called Dr. Urban Gibson office for further recommendations and reports that 2 new prescriptions were sent to the pharmacy, which she plans to start today.  She has an upcoming abdominal CT scheduled on 03/03/20 for further evaluation.  She was advised to hold Enbrel until she is cleared by Dr. Chales Abrahams to restart.    Primary osteoarthritis of both hands:  She has no joint tenderness or inflammation on examination today.  She is able to make a complete fist bilaterally.  She has been experiencing some increased discomfort and stiffness in both hands and both wrist joints recently.  Joint protection and muscle strengthening were discussed.  Primary osteoarthritis of both hips: She has good range of motion of both hip joints on examination today.  She is not experiencing discomfort at this time.  Sicca syndrome [facility]): She continues to have chronic sicca symptoms.  History of scoliosis: She experiences intermittent discomfort in her lower back.  She has  no symptoms of radiculopathy at this time.  Elevated LFTs: CMP will be checked today.  Paresthesia of left upper extremity: Resolved.  Orders: No orders of the defined types were placed in this encounter.  No orders of the defined types were placed in this encounter.   Follow-Up Instructions: Return in about 3 months (around 05/29/2020) for Rheumatoid arthritis, Osteoarthritis.   Gearldine Bienenstock, PA-C  Note - This record has been created using Dragon software.  Chart creation errors have been sought, but may not always  have been located. Such creation errors do not reflect on  the standard of medical care.

## 2020-02-25 ENCOUNTER — Telehealth: Payer: Self-pay | Admitting: Gastroenterology

## 2020-02-25 NOTE — Telephone Encounter (Signed)
Lets proceed with -Check CBC, CMP, CRP. -CT Abdo/pelvis with p.o. and IV contrast ASAP. -Call in Levaquin 500 mg p.o. once a day and metronidazole 500mg  TID x 10 days (already taken 3 days of antibiotics)  RG

## 2020-02-25 NOTE — Telephone Encounter (Signed)
Spoke to patient who reports over the past week having left mid/lower abdominal pain. She has a HX of  pancolonic diverticulosis DX 3/21. She continues to take Benefiber and avoids NSAIDs.Recent labs 02/14/20 CMET with elevated ALT=40 And AP=126. She has had 2 flare up  Episodes, last one in August with noted abscess. Patient is concerned that this flare up may turn into another diverticulitis attack no fever or vomiting,mild  Nausea.,She had a fe Cipro and Flagyl left over that she started on 02/23/20. Has enough to last 1 more day. Dr Chales Abrahams please advise for further treatment.

## 2020-02-26 ENCOUNTER — Other Ambulatory Visit: Payer: Self-pay | Admitting: Gastroenterology

## 2020-02-26 DIAGNOSIS — K5732 Diverticulitis of large intestine without perforation or abscess without bleeding: Secondary | ICD-10-CM

## 2020-02-26 MED ORDER — LEVOFLOXACIN 500 MG PO TABS: 500.0000 mg | ORAL_TABLET | Freq: Three times a day (TID) | ORAL | 0 refills | Status: AC

## 2020-02-26 MED ORDER — METRONIDAZOLE 500 MG PO TABS: 500.0000 mg | ORAL_TABLET | Freq: Two times a day (BID) | ORAL | 0 refills | Status: DC

## 2020-02-26 NOTE — Telephone Encounter (Signed)
Spoke to patient to inform her of Dr Urban Gibson recommendations. She will have her CT done tomorrow at [facility] CT at 1  pm then come by our [facility] office for labs. Medication sent to the pharmacy. All questions answered. Patient voiced understanding.

## 2020-02-27 ENCOUNTER — Ambulatory Visit (HOSPITAL_BASED_OUTPATIENT_CLINIC_OR_DEPARTMENT_OTHER): Payer: Managed Care, Other (non HMO)

## 2020-02-27 ENCOUNTER — Ambulatory Visit (INDEPENDENT_AMBULATORY_CARE_PROVIDER_SITE_OTHER): Payer: Managed Care, Other (non HMO) | Admitting: Physician Assistant

## 2020-02-27 ENCOUNTER — Encounter: Payer: Self-pay | Admitting: Physician Assistant

## 2020-02-27 ENCOUNTER — Inpatient Hospital Stay: Admission: RE | Admit: 2020-02-27 | Payer: Managed Care, Other (non HMO) | Source: Ambulatory Visit

## 2020-02-27 ENCOUNTER — Other Ambulatory Visit: Payer: Self-pay

## 2020-02-27 VITALS — BP 115/74 | HR 65 | Ht 64.0 in | Wt 161.8 lb

## 2020-02-27 DIAGNOSIS — M3500 Sicca syndrome, unspecified: Secondary | ICD-10-CM

## 2020-02-27 DIAGNOSIS — R7989 Other specified abnormal findings of blood chemistry: Secondary | ICD-10-CM

## 2020-02-27 DIAGNOSIS — R202 Paresthesia of skin: Secondary | ICD-10-CM

## 2020-02-27 DIAGNOSIS — K5792 Diverticulitis of intestine, part unspecified, without perforation or abscess without bleeding: Secondary | ICD-10-CM

## 2020-02-27 DIAGNOSIS — M0609 Rheumatoid arthritis without rheumatoid factor, multiple sites: Secondary | ICD-10-CM

## 2020-02-27 DIAGNOSIS — Z8739 Personal history of other diseases of the musculoskeletal system and connective tissue: Secondary | ICD-10-CM

## 2020-02-27 DIAGNOSIS — Z79899 Other long term (current) drug therapy: Secondary | ICD-10-CM | POA: Diagnosis not present

## 2020-02-27 DIAGNOSIS — M19041 Primary osteoarthritis, right hand: Secondary | ICD-10-CM | POA: Diagnosis not present

## 2020-02-27 DIAGNOSIS — M160 Bilateral primary osteoarthritis of hip: Secondary | ICD-10-CM

## 2020-02-27 DIAGNOSIS — M19042 Primary osteoarthritis, left hand: Secondary | ICD-10-CM

## 2020-02-28 LAB — CBC WITH DIFFERENTIAL/PLATELET
Basophils Absolute: 0 10*3/uL (ref 0.0–0.2)
Basos: 1 %
EOS (ABSOLUTE): 0.2 10*3/uL (ref 0.0–0.4)
Eos: 4 %
Hematocrit: 38.6 % (ref 34.0–46.6)
Hemoglobin: 12.8 g/dL (ref 11.1–15.9)
Immature Grans (Abs): 0 10*3/uL (ref 0.0–0.1)
Immature Granulocytes: 0 %
Lymphocytes Absolute: 2 10*3/uL (ref 0.7–3.1)
Lymphs: 34 %
MCH: 28.5 pg (ref 26.6–33.0)
MCHC: 33.2 g/dL (ref 31.5–35.7)
MCV: 86 fL (ref 79–97)
Monocytes Absolute: 0.5 10*3/uL (ref 0.1–0.9)
Monocytes: 8 %
Neutrophils Absolute: 3.1 10*3/uL (ref 1.4–7.0)
Neutrophils: 53 %
Platelets: 327 10*3/uL (ref 150–450)
RBC: 4.49 x10E6/uL (ref 3.77–5.28)
RDW: 12.1 % (ref 11.7–15.4)
WBC: 5.8 10*3/uL (ref 3.4–10.8)

## 2020-02-28 LAB — COMPREHENSIVE METABOLIC PANEL
ALT: 48 IU/L — ABNORMAL HIGH (ref 0–32)
AST: 44 IU/L — ABNORMAL HIGH (ref 0–40)
Albumin/Globulin Ratio: 1.7 (ref 1.2–2.2)
Albumin: 4.5 g/dL (ref 3.8–4.8)
Alkaline Phosphatase: 88 IU/L (ref 44–121)
BUN/Creatinine Ratio: 13 (ref 9–23)
BUN: 8 mg/dL (ref 6–24)
Bilirubin Total: 0.3 mg/dL (ref 0.0–1.2)
CO2: 23 mmol/L (ref 20–29)
Calcium: 9.1 mg/dL (ref 8.7–10.2)
Chloride: 101 mmol/L (ref 96–106)
Creatinine, Ser: 0.62 mg/dL (ref 0.57–1.00)
GFR calc Af Amer: 124 mL/min/{1.73_m2} (ref >59)
GFR calc non Af Amer: 108 mL/min/{1.73_m2} (ref >59)
Globulin, Total: 2.7 g/dL (ref 1.5–4.5)
Glucose: 107 mg/dL — ABNORMAL HIGH (ref 65–99)
Potassium: 4.4 mmol/L (ref 3.5–5.2)
Sodium: 140 mmol/L (ref 134–144)
Total Protein: 7.2 g/dL (ref 6.0–8.5)

## 2020-02-28 LAB — HIGH SENSITIVITY CRP: CRP, High Sensitivity: 3.05 mg/L — ABNORMAL HIGH (ref 0.00–3.00)

## 2020-03-03 ENCOUNTER — Ambulatory Visit (INDEPENDENT_AMBULATORY_CARE_PROVIDER_SITE_OTHER)
Admission: RE | Admit: 2020-03-03 | Discharge: 2020-03-03 | Disposition: A | Discharge status: Home / Self Care | Payer: Managed Care, Other (non HMO) | Source: Ambulatory Visit | Attending: Gastroenterology | Admitting: Gastroenterology

## 2020-03-03 ENCOUNTER — Other Ambulatory Visit: Payer: Self-pay

## 2020-03-03 DIAGNOSIS — K5732 Diverticulitis of large intestine without perforation or abscess without bleeding: Secondary | ICD-10-CM

## 2020-03-03 MED ORDER — IOHEXOL 300 MG/ML  SOLN
100.0000 mL | Freq: Once | INTRAMUSCULAR | Status: AC | PRN
  Administered 2020-03-03: 100 mL via INTRAVENOUS

## 2020-03-07 ENCOUNTER — Ambulatory Visit: Payer: Managed Care, Other (non HMO) | Admitting: Gastroenterology

## 2020-05-14 NOTE — Progress Notes (Deleted)
Office Visit Note  Patient: Michelle Pierce             Date of Birth: 03-19-1971           MRN: [id number]             PCP: Gordan Payment., MD Referring: Gordan Payment., MD Visit Date: 05/28/2020 Occupation: @GUAROCC @  Subjective:  No chief complaint on file.   History of Present Illness: Michelle Pierce is a 49 y.o. female ***   Activities of Daily Living:  Patient reports morning stiffness for *** {minute/hour:19697}.   Patient {ACTIONS;DENIES/REPORTS:21021675::"Denies"} nocturnal pain.  Difficulty dressing/grooming: {ACTIONS;DENIES/REPORTS:21021675::"Denies"} Difficulty climbing stairs: {ACTIONS;DENIES/REPORTS:21021675::"Denies"} Difficulty getting out of chair: {ACTIONS;DENIES/REPORTS:21021675::"Denies"} Difficulty using hands for taps, buttons, cutlery, and/or writing: {ACTIONS;DENIES/REPORTS:21021675::"Denies"}  No Rheumatology ROS completed.   PMFS History:  Patient Active Problem List   Diagnosis Date Noted  . Diverticulitis 09/05/2019  . Juvenile rheumatoid arthritis (HCC) 12/27/2018  . Anxiety disorder 07/27/2018  . 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 Attention deficit hyperactivity disorder (ADHD), predominantly inattentive type 07/27/2018  . Malaise and fatigue 07/27/2018  . Overweight 07/27/2018  . Dyspnea 02/06/2018  . Stress at home 02/06/2018  . Primary osteoarthritis of both hips 12/30/2016  . History of scoliosis 12/30/2016  . Elevated LFTs 12/30/2016  . Sicca syndrome, unspecified 09/02/2016  . Rheumatoid arthritis of multiple sites without rheumatoid factor (HCC) 04/08/2016  . High risk medication use 04/08/2016  . Osteoarthritis, 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    Past Medical History:  Diagnosis Date  . Diverticulitis    per patient   . Rheumatoid arthritis (HCC)     Family History  Problem Relation Age of Onset  . Hypertension Mother   . Irritable bowel syndrome Mother   . Stomach cancer Father   . Lung cancer Father   . Diverticulitis Father    . Sleep apnea Sister   . Ulcerative colitis Son   . Cancer Paternal Grandmother   . Diverticulitis Paternal Grandmother   . Hodgkin's lymphoma Paternal Grandmother   . Prostate cancer Other        Aunts grandfather on dad side   . Bone cancer Other   . Diverticulitis Paternal Aunt        had colon surgery and colostomy    Past Surgical History:  Procedure Laterality Date  . COLONOSCOPY  10/19/2019   [facility]. Mild diverticulosis was noted throughout the entire examined colon. The colon musoca was otherwise normal. Retroflexed views revealed internal Grade II second degree hemorrhoids.   [facility] KNEE ARTHROSCOPY Bilateral    Social History   Social History Narrative  . Not on file   Immunization History  Administered Date(s) Administered  . Influenza-Unspecified 03/07/2014, 02/24/2015, 02/22/2016  . PFIZER SARS-COV-2 Vaccination 08/16/2019, 09/10/2019     Objective: Vital Signs: There were no vitals taken for this visit.   Physical Exam   Musculoskeletal Exam: ***  CDAI Exam: CDAI Score: -- Patient Global: --; Provider Global: -- Swollen: --; Tender: -- Joint Exam 05/28/2020   No joint exam has been documented for this visit   There is currently no information documented on the homunculus. Go to the Rheumatology activity and complete the homunculus joint exam.  Investigation: No additional findings.  Imaging: No results found.  Recent Labs: Lab Results  Component Value Date   WBC 5.8 02/27/2020   HGB 12.8 02/27/2020   PLT 327 02/27/2020   NA 140 02/27/2020   K 4.4 02/27/2020   CL 101 02/27/2020  CO2 23 02/27/2020   GLUCOSE 107 (H) 02/27/2020   BUN 8 02/27/2020   CREATININE 0.62 02/27/2020   BILITOT 0.3 02/27/2020   ALKPHOS 88 02/27/2020   AST 44 (H) 02/27/2020   ALT 48 (H) 02/27/2020   PROT 7.2 02/27/2020   ALBUMIN 4.5 02/27/2020   CALCIUM 9.1 02/27/2020   GFRAA 124 02/27/2020    Speciality Comments: No specialty comments  available.  Procedures:  No procedures performed Allergies: Sulfa antibiotics, Factive [gemifloxacin], and Penicillins   Assessment / Plan:     Visit Diagnoses: No diagnosis found.  Orders: No orders of the defined types were placed in this encounter.  No orders of the defined types were placed in this encounter.   Face-to-face time spent with patient was *** minutes. Greater than 50% of time was spent in counseling and coordination of care.  Follow-Up Instructions: No follow-ups on file.   Ellen Henri, CMA  Note - This record has been created using Animal nutritionist.  Chart creation errors have been sought, but may not always  have been located. Such creation errors do not reflect on  the standard of medical care.

## 2020-05-28 ENCOUNTER — Ambulatory Visit: Payer: Managed Care, Other (non HMO) | Admitting: Rheumatology

## 2020-05-28 ENCOUNTER — Telehealth: Payer: Self-pay | Admitting: *Deleted

## 2020-05-28 NOTE — Telephone Encounter (Signed)
Patient contacted the office stating she has tested positive for Covid. Patient states her symptoms started on 05/23/2020. Patient was tested on 05/26/2020 and received her results today. Patient advised to hold her Enbrel for 2-3 weeks after symptoms resolve. Contacted the Monoclonal antibody infusion center to leave patient's information to have patient set up for infusion if possible.

## 2020-06-11 NOTE — Progress Notes (Signed)
Office Visit Note  Patient: Michelle Pierce             Date of Birth: 09/08/1970           MRN: [id number]             PCP: Gordan Payment., MD Referring: Gordan Payment., MD Visit Date: 06/25/2020 Occupation: @GUAROCC @  Subjective:  Other (Recent sciatica flare. Patient reports last dose of enbrel was 05/22/20, tested positive for COVID on 05/28/2020)   History of Present Illness: Michelle Pierce is a 50 y.o. female with history of rheumatoid arthritis.  She states she has had chronic lower back pain for many years.  She has been going to a 57.  She states that 1 week ago she was vacuuming the floor and had sudden sharp pain in her lower back radiating to her left lower extremity.  She was seen by her PCP the following day and received a cortisone injection and was given Flexeril.  She also went to her chiropractor this week.  She had some relief from the chiropractic care but the pain persist.  She has been experiencing lot of discomfort in the lower back radiating to her left lower extremity.  The paresthesias in her left hand has improved.  She has not noticed any flare of rheumatoid arthritis.  She had COVID-19 infection on May 28, 2020 without complications.  She did not receive monoclonal antibody infusion.  She has recovered fully.  She has a stopped Enbrel and she will be starting it again.  Not having any joint swelling currently.  Activities of Daily Living:  Patient reports morning stiffness for 0 minutes.   Patient Denies nocturnal pain.  Difficulty dressing/grooming: Denies Difficulty climbing stairs: Denies Difficulty getting out of chair: Denies Difficulty using hands for taps, buttons, cutlery, and/or writing: Denies  Review of Systems  Constitutional: Positive for fatigue.  HENT: Positive for mouth dryness and nose dryness. Negative for mouth sores.   Eyes: Positive for pain, itching and dryness.  Respiratory: Negative for shortness of breath and  difficulty breathing.   Cardiovascular: Negative for chest pain and palpitations.  Gastrointestinal: Negative for blood in stool, constipation and diarrhea.  Endocrine: Negative for increased urination.  Genitourinary: Negative for difficulty urinating.  Musculoskeletal: Positive for myalgias, muscle tenderness and myalgias. Negative for arthralgias, joint pain, joint swelling and morning stiffness.  Skin: Negative for color change, rash and redness.  Allergic/Immunologic: Negative for susceptible to infections.  Neurological: Positive for weakness. Negative for dizziness, numbness, headaches and memory loss.  Hematological: Negative for bruising/bleeding tendency.  Psychiatric/Behavioral: Negative for confusion.    PMFS History:  Patient Active Problem List   Diagnosis Date Noted  . Diverticulitis 09/05/2019  . Juvenile rheumatoid arthritis (HCC) 12/27/2018  . Anxiety disorder 07/27/2018  . 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 Attention deficit hyperactivity disorder (ADHD), predominantly inattentive type 07/27/2018  . Malaise and fatigue 07/27/2018  . Overweight 07/27/2018  . Dyspnea 02/06/2018  . Stress at home 02/06/2018  . Primary osteoarthritis of both hips 12/30/2016  . History of scoliosis 12/30/2016  . Elevated LFTs 12/30/2016  . Sicca syndrome, unspecified 09/02/2016  . Rheumatoid arthritis of multiple sites without rheumatoid factor (HCC) 04/08/2016  . High risk medication use 04/08/2016  . Osteoarthritis, 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    Past Medical History:  Diagnosis Date  . Diverticulitis    per patient   . Rheumatoid arthritis (HCC)     Family History  Problem Relation Age of  Onset  . Hypertension Mother   . Irritable bowel syndrome Mother   . Stomach cancer Father   . Lung cancer Father   . Diverticulitis Father   . Sleep apnea Sister   . Ulcerative colitis Son   . Cancer Paternal Grandmother   . Diverticulitis Paternal Grandmother   . Hodgkin's lymphoma Paternal  Grandmother   . Prostate cancer Other        Aunts grandfather on dad side   . Bone cancer Other   . Diverticulitis Paternal Aunt        had colon surgery and colostomy    Past Surgical History:  Procedure Laterality Date  . COLONOSCOPY  10/19/2019   [facility]. Mild diverticulosis was noted throughout the entire examined colon. The colon musoca was otherwise normal. Retroflexed views revealed internal Grade II second degree hemorrhoids.   Marland Kitchen KNEE ARTHROSCOPY Bilateral    Social History   Social History Narrative  . Not on file   Immunization History  Administered Date(s) Administered  . Influenza-Unspecified 03/07/2014, 02/24/2015, 02/22/2016  . Moderna Sars-Covid-2 Vaccination 06/13/2020  . PFIZER(Purple Top)SARS-COV-2 Vaccination 08/16/2019, 09/10/2019     Objective: Vital Signs: BP 137/79 (BP Location: Left Arm, Patient Position: Sitting, Cuff Size: Normal)   Pulse 76   Resp 14   Ht 5\' 5"  (1.651 m)   Wt 168 lb 3.2 oz (76.3 kg)   BMI 27.99 kg/m    Physical Exam Vitals and nursing note reviewed.  Constitutional:      Appearance: She is well-developed and well-nourished.  HENT:     Head: Normocephalic and atraumatic.  Eyes:     Extraocular Movements: EOM normal.     Conjunctiva/sclera: Conjunctivae normal.  Cardiovascular:     Rate and Rhythm: Normal rate and regular rhythm.     Pulses: Intact distal pulses.     Heart sounds: Normal heart sounds.  Pulmonary:     Effort: Pulmonary effort is normal.     Breath sounds: Normal breath sounds.  Abdominal:     General: Bowel sounds are normal.     Palpations: Abdomen is soft.  Musculoskeletal:     Cervical back: Normal range of motion.  Lymphadenopathy:     Cervical: No cervical adenopathy.  Skin:    General: Skin is warm and dry.     Capillary Refill: Capillary refill takes less than 2 seconds.  Neurological:     Mental Status: She is alert and oriented to person, place, and time.  Psychiatric:        Mood  and Affect: Mood and affect normal.        Behavior: Behavior normal.      Musculoskeletal Exam: C-spine was in good range of motion.  She has painful range of motion of her lumbar spine with left-sided radiculopathy.  Shoulder joints, elbow joints, wrist joints, MCPs PIPs and DIPs with good range of motion with no synovitis.  Hip joints, knee joints, ankles, MTPs and PIPs with good range of motion with no synovitis.  CDAI Exam: CDAI Score: 0.4  Patient Global: 2 mm; Provider Global: 2 mm Swollen: 0 ; Tender: 0  Joint Exam 06/25/2020   No joint exam has been documented for this visit   There is currently no information documented on the homunculus. Go to the Rheumatology activity and complete the homunculus joint exam.  Investigation: No additional findings.  Imaging: No results found.  Recent Labs: Lab Results  Component Value Date   WBC 5.8 02/27/2020  HGB 12.8 02/27/2020   PLT 327 02/27/2020   NA 140 02/27/2020   K 4.4 02/27/2020   CL 101 02/27/2020   CO2 23 02/27/2020   GLUCOSE 107 (H) 02/27/2020   BUN 8 02/27/2020   CREATININE 0.62 02/27/2020   BILITOT 0.3 02/27/2020   ALKPHOS 88 02/27/2020   AST 44 (H) 02/27/2020   ALT 48 (H) 02/27/2020   PROT 7.2 02/27/2020   ALBUMIN 4.5 02/27/2020   CALCIUM 9.1 02/27/2020   GFRAA 124 02/27/2020    Speciality Comments: No specialty comments available.  Procedures:  No procedures performed Allergies: Sulfa antibiotics, Factive [gemifloxacin], and Penicillins   Assessment / Plan:     Visit Diagnoses: Rheumatoid arthritis of multiple sites without rheumatoid factor (HCC)-patient has been off Enbrel due to COVID-19 infection.  She will be resuming Enbrel this week.  She had no synovitis on my examination.  High risk medication use - Enbrel SureClick 50 mg sq injections every 7 days.  Methotrexate discontinued due to elevated LFTs in June 2019 - Plan: CBC with Differential/Platelet, COMPLETE METABOLIC PANEL WITH GFR,  QuantiFERON-TB Gold Plus today.  She will be getting CBC and CMP every 3 months to monitor for drug toxicity.  Chronic midline low back pain with left-sided sciatica -patient states she has had lower back pain for many years.  She has been followed by chiropractor.  She developed acute flare of her lower back pain a week ago after vacuuming.  The pain has been radiating into her left lower extremity.  She was given intramuscular steroids and Flexeril by her PCP with some relief.  She is still having difficulty walking.  She has to miss work this afternoon.  Plan: XR Lumbar Spine 2-3 Views x-ray findings were discussed with the patient.  She has scoliosis, multilevel spondylosis and facet joint arthropathy.  I will refer her to physical therapy.  Have also given her a handout on exercises.  If the pain persist then she may need MRI to evaluate this further.  History of scoliosis  Primary osteoarthritis of both hands-she has mild osteoarthritic changes.  Primary osteoarthritis of both hips-she had good range of motion of bilateral hip joints without discomfort  Sicca syndrome (HCC)-over-the-counter products were discussed.  Diverticulitis - She was diagnosed with acute diverticulitis on 3/26/21and was treated outpatient with oral ciprofloxacin and flagyl.   Elevated LFTs-we will repeat labs today.  Educated but COVID-19 virus infection she is fully vaccinated against COVID-19.  She also received a booster.  I discussed her fourth dose which she can consider in the future.  Use of mask, social distancing and hand hygiene was discussed.  She recently had COVID-19 infection.  Orders: Orders Placed This Encounter  Procedures  . XR Lumbar Spine 2-3 Views  . CBC with Differential/Platelet  . COMPLETE METABOLIC PANEL WITH GFR  . QuantiFERON-TB Gold Plus   No orders of the defined types were placed in this encounter.    Follow-Up Instructions: Return in about 5 months (around 11/22/2020) for  Rheumatoid arthritis.   Pollyann Savoy, MD  Note - This record has been created using Animal nutritionist.  Chart creation errors have been sought, but may not always  have been located. Such creation errors do not reflect on  the standard of medical care.

## 2020-06-12 ENCOUNTER — Telehealth: Payer: Self-pay

## 2020-06-12 NOTE — Telephone Encounter (Signed)
Patient states she had Covid on 05/26/2020. Patient states she did not receive the monoclonal antibody infusion. Patient states she did have 2 doses of the Pfizer vaccine. Patient states she has talked with the Health Department and they have advised her to get the booster. She is scheduled for 06/13/2020 to get the 3 rd dose. Patient wants to know if this is okay. Please advise.

## 2020-06-12 NOTE — Telephone Encounter (Signed)
Yes, she may receive the booster.  The immunity usually lasts for 3 months.  The recommendations are to get  the booster anywhere from after recovering from the COVID-19 infection till within 3 months.

## 2020-06-12 NOTE — Telephone Encounter (Signed)
Patient called stating she scheduled her booster vaccine for tomorrow 06/13/20 at 4:00 pm.  Patient requested a return call before her appointment with Dr. Fatima Sanger recommendations.

## 2020-06-12 NOTE — Telephone Encounter (Signed)
Left message to advise patient Yes, she may receive the booster.  The immunity usually lasts for 3 months.  The recommendations are to get  the booster anywhere from after recovering from the COVID-19 infection till within 3 months.

## 2020-06-25 ENCOUNTER — Encounter: Payer: Self-pay | Admitting: Rheumatology

## 2020-06-25 ENCOUNTER — Other Ambulatory Visit: Payer: Self-pay

## 2020-06-25 ENCOUNTER — Ambulatory Visit: Payer: Self-pay

## 2020-06-25 ENCOUNTER — Ambulatory Visit (INDEPENDENT_AMBULATORY_CARE_PROVIDER_SITE_OTHER): Payer: Managed Care, Other (non HMO) | Admitting: Rheumatology

## 2020-06-25 VITALS — BP 137/79 | HR 76 | Resp 14 | Ht 65.0 in | Wt 168.2 lb

## 2020-06-25 DIAGNOSIS — M19042 Primary osteoarthritis, left hand: Secondary | ICD-10-CM

## 2020-06-25 DIAGNOSIS — M19041 Primary osteoarthritis, right hand: Secondary | ICD-10-CM

## 2020-06-25 DIAGNOSIS — Z8739 Personal history of other diseases of the musculoskeletal system and connective tissue: Secondary | ICD-10-CM | POA: Diagnosis not present

## 2020-06-25 DIAGNOSIS — G8929 Other chronic pain: Secondary | ICD-10-CM | POA: Diagnosis not present

## 2020-06-25 DIAGNOSIS — Z79899 Other long term (current) drug therapy: Secondary | ICD-10-CM | POA: Diagnosis not present

## 2020-06-25 DIAGNOSIS — M0609 Rheumatoid arthritis without rheumatoid factor, multiple sites: Secondary | ICD-10-CM

## 2020-06-25 DIAGNOSIS — M3500 Sicca syndrome, unspecified: Secondary | ICD-10-CM

## 2020-06-25 DIAGNOSIS — M5442 Lumbago with sciatica, left side: Secondary | ICD-10-CM

## 2020-06-25 DIAGNOSIS — R202 Paresthesia of skin: Secondary | ICD-10-CM

## 2020-06-25 DIAGNOSIS — K5792 Diverticulitis of intestine, part unspecified, without perforation or abscess without bleeding: Secondary | ICD-10-CM

## 2020-06-25 DIAGNOSIS — R7989 Other specified abnormal findings of blood chemistry: Secondary | ICD-10-CM

## 2020-06-25 DIAGNOSIS — M160 Bilateral primary osteoarthritis of hip: Secondary | ICD-10-CM

## 2020-06-25 NOTE — Patient Instructions (Addendum)
Standing Labs We placed an order today for your standing lab work.   Please have your standing labs drawn in May  And every 3 months  If possible, please have your labs drawn 2 weeks prior to your appointment so that the provider can discuss your results at your appointment.  We have open lab daily Monday through Thursday from 8:30-12:30 PM and 1:30-4:30 PM and Friday from 8:30-12:30 PM and 1:30-4:00 PM at the office of Dr. Pollyann Savoy, [facility].   Please be advised, all patients with office appointments requiring lab work will take precedents over walk-in lab work.  If possible, please come for your lab work on Monday and Friday afternoons, as you may experience shorter wait times. The office is located at [address], [address], [place], [place] [address] No appointment is necessary.   Labs are drawn by [facility]. Please bring your co-pay at the time of your lab draw.  You may receive a bill from [facility] for your lab work.  If you wish to have your labs drawn at another location, please call the office 24 hours in advance to send orders.  If you have any questions regarding directions or hours of operation,  please call [phone].   As a reminder, please drink plenty of water prior to coming for your lab work. Thanks!  Back Exercises The following exercises strengthen the muscles that help to support the trunk and back. They also help to keep the lower back flexible. Doing these exercises can help to prevent back pain or lessen existing pain.  If you have back pain or discomfort, try doing these exercises 2-3 times each day or as told by your health care provider.  As your pain improves, do them once each day, but increase the number of times that you repeat the steps for each exercise (do more repetitions).  To prevent the recurrence of back pain, continue to do these exercises once each day or as told by your health care provider. Do exercises exactly as told by  your health care provider and adjust them as directed. It is normal to feel mild stretching, pulling, tightness, or discomfort as you do these exercises, but you should stop right away if you feel sudden pain or your pain gets worse. Exercises Single knee to chest Repeat these steps 3-5 times for each leg: 1. Lie on your back on a firm bed or the floor with your legs extended. 2. Bring one knee to your chest. Your other leg should stay extended and in contact with the floor. 3. Hold your knee in place by grabbing your knee or thigh with both hands and hold. 4. Pull on your knee until you feel a gentle stretch in your lower back or buttocks. 5. Hold the stretch for 10-30 seconds. 6. Slowly release and straighten your leg. Pelvic tilt Repeat these steps 5-10 times: 1. Lie on your back on a firm bed or the floor with your legs extended. 2. Bend your knees so they are pointing toward the ceiling and your feet are flat on the floor. 3. Tighten your lower abdominal muscles to press your lower back against the floor. This motion will tilt your pelvis so your tailbone points up toward the ceiling instead of pointing to your feet or the floor. 4. With gentle tension and even breathing, hold this position for 5-10 seconds. Cat-cow Repeat these steps until your lower back becomes more flexible: 1. Get into a hands-and-knees position on a firm  surface. Keep your hands under your shoulders, and keep your knees under your hips. You may place padding under your knees for comfort. 2. Let your head hang down toward your chest. Contract your abdominal muscles and point your tailbone toward the floor so your lower back becomes rounded like the back of a cat. 3. Hold this position for 5 seconds. 4. Slowly lift your head, let your abdominal muscles relax and point your tailbone up toward the ceiling so your back forms a sagging arch like the back of a cow. 5. Hold this position for 5 seconds.   Press-ups Repeat  these steps 5-10 times: 1. Lie on your abdomen (face-down) on the floor. 2. Place your palms near your head, about shoulder-width apart. 3. Keeping your back as relaxed as possible and keeping your hips on the floor, slowly straighten your arms to raise the top half of your body and lift your shoulders. Do not use your back muscles to raise your upper torso. You may adjust the placement of your hands to make yourself more comfortable. 4. Hold this position for 5 seconds while you keep your back relaxed. 5. Slowly return to lying flat on the floor.   Bridges Repeat these steps 10 times: 1. Lie on your back on a firm surface. 2. Bend your knees so they are pointing toward the ceiling and your feet are flat on the floor. Your arms should be flat at your sides, next to your body. 3. Tighten your buttocks muscles and lift your buttocks off the floor until your waist is at almost the same height as your knees. You should feel the muscles working in your buttocks and the back of your thighs. If you do not feel these muscles, slide your feet 1-2 inches farther away from your buttocks. 4. Hold this position for 3-5 seconds. 5. Slowly lower your hips to the starting position, and allow your buttocks muscles to relax completely. If this exercise is too easy, try doing it with your arms crossed over your chest.   Abdominal crunches Repeat these steps 5-10 times: 1. Lie on your back on a firm bed or the floor with your legs extended. 2. Bend your knees so they are pointing toward the ceiling and your feet are flat on the floor. 3. Cross your arms over your chest. 4. Tip your chin slightly toward your chest without bending your neck. 5. Tighten your abdominal muscles and slowly raise your trunk (torso) high enough to lift your shoulder blades a tiny bit off the floor. Avoid raising your torso higher than that because it can put too much stress on your low back and does not help to strengthen your abdominal  muscles. 6. Slowly return to your starting position. Back lifts Repeat these steps 5-10 times: 1. Lie on your abdomen (face-down) with your arms at your sides, and rest your forehead on the floor. 2. Tighten the muscles in your legs and your buttocks. 3. Slowly lift your chest off the floor while you keep your hips pressed to the floor. Keep the back of your head in line with the curve in your back. Your eyes should be looking at the floor. 4. Hold this position for 3-5 seconds. 5. Slowly return to your starting position. Contact a health care provider if:  Your back pain or discomfort gets much worse when you do an exercise.  Your worsening back pain or discomfort does not lessen within 2 hours after you exercise. If you have any  of these problems, stop doing these exercises right away. Do not do them again unless your health care provider says that you can. Get help right away if:  You develop sudden, severe back pain. If this happens, stop doing the exercises right away. Do not do them again unless your health care provider says that you can. This information is not intended to replace advice given to you by your health care provider. Make sure you discuss any questions you have with your health care provider. Document Revised: 09/14/2018 Document Reviewed: 02/09/2018 Elsevier Patient Education  2021 [employer].

## 2020-06-27 LAB — CBC WITH DIFFERENTIAL/PLATELET
Absolute Monocytes: 518 cells/uL (ref 200–950)
Basophils Absolute: 43 cells/uL (ref 0–200)
Basophils Relative: 0.4 %
Eosinophils Absolute: 43 cells/uL (ref 15–500)
Eosinophils Relative: 0.4 %
HCT: 40.3 % (ref 35.0–45.0)
Hemoglobin: 13.6 g/dL (ref 11.7–15.5)
Lymphs Abs: 2246 cells/uL (ref 850–3900)
MCH: 29.4 pg (ref 27.0–33.0)
MCHC: 33.7 g/dL (ref 32.0–36.0)
MCV: 87.2 fL (ref 80.0–100.0)
MPV: 10 fL (ref 7.5–12.5)
Monocytes Relative: 4.8 %
Neutro Abs: 7949 cells/uL — ABNORMAL HIGH (ref 1500–7800)
Neutrophils Relative %: 73.6 %
Platelets: 345 10*3/uL (ref 140–400)
RBC: 4.62 10*6/uL (ref 3.80–5.10)
RDW: 11.8 % (ref 11.0–15.0)
Total Lymphocyte: 20.8 %
WBC: 10.8 10*3/uL (ref 3.8–10.8)

## 2020-06-27 LAB — COMPLETE METABOLIC PANEL WITH GFR
AG Ratio: 1.6 (calc) (ref 1.0–2.5)
ALT: 18 U/L (ref 6–29)
AST: 19 U/L (ref 10–35)
Albumin: 4.5 g/dL (ref 3.6–5.1)
Alkaline phosphatase (APISO): 84 U/L (ref 31–125)
BUN: 10 mg/dL (ref 7–25)
CO2: 27 mmol/L (ref 20–32)
Calcium: 9.8 mg/dL (ref 8.6–10.2)
Chloride: 102 mmol/L (ref 98–110)
Creat: 0.5 mg/dL (ref 0.50–1.10)
GFR, Est African American: 134 mL/min/{1.73_m2} (ref >=60)
GFR, Est Non African American: 115 mL/min/{1.73_m2} (ref >=60)
Globulin: 2.9 g/dL (calc) (ref 1.9–3.7)
Glucose, Bld: 93 mg/dL (ref 65–99)
Potassium: 4.8 mmol/L (ref 3.5–5.3)
Sodium: 140 mmol/L (ref 135–146)
Total Bilirubin: 0.4 mg/dL (ref 0.2–1.2)
Total Protein: 7.4 g/dL (ref 6.1–8.1)

## 2020-06-27 LAB — QUANTIFERON-TB GOLD PLUS
Mitogen-NIL: >10 IU/mL
NIL: 0.03 IU/mL
QuantiFERON-TB Gold Plus: NEGATIVE
TB1-NIL: <0 IU/mL
TB2-NIL: 0.02 IU/mL

## 2020-06-28 NOTE — Progress Notes (Signed)
TB gold is negative.

## 2020-07-10 ENCOUNTER — Telehealth: Payer: Self-pay

## 2020-07-10 NOTE — Telephone Encounter (Signed)
Brittney from [facility] left a voicemail stating patient had an appointment on 06/27/20.

## 2020-11-12 NOTE — Progress Notes (Signed)
Office Visit Note  Patient: Michelle Pierce             Date of Birth: Oct 08, 1970           MRN: [id number]             PCP: Gordan Payment., MD Referring: Gordan Payment., MD Visit Date: 11/26/2020 Occupation: @GUAROCC @  Subjective:  Medication management.   History of Present Illness: Michelle Pierce is a 50 y.o. female with a history of rheumatoid arthritis.  She states she has been doing well on Enbrel every other week now.  She denies any joint swelling.  She states her lower back pain is better although she has noticed that she has been dragging her left foot recently.  She is also noticed that the muscle mass in her left lower extremity is larger than the right lower extremity.  Activities of Daily Living:  Patient reports morning stiffness for 0 minutes.   Patient Denies nocturnal pain.  Difficulty dressing/grooming: Denies Difficulty climbing stairs: Denies Difficulty getting out of chair: Denies Difficulty using hands for taps, buttons, cutlery, and/or writing: Denies  Review of Systems  Constitutional:  Positive for fatigue.  HENT:  Positive for mouth dryness. Negative for mouth sores and nose dryness.   Eyes:  Positive for itching and dryness. Negative for pain.  Respiratory:  Negative for shortness of breath and difficulty breathing.   Cardiovascular:  Negative for chest pain and palpitations.  Gastrointestinal:  Negative for blood in stool, constipation and diarrhea.  Endocrine: Negative for increased urination.  Genitourinary:  Negative for difficulty urinating.  Musculoskeletal:  Negative for joint pain, joint pain, joint swelling, myalgias, morning stiffness, muscle tenderness and myalgias.  Skin:  Negative for color change, rash and redness.  Allergic/Immunologic: Negative for susceptible to infections.  Neurological:  Negative for dizziness, numbness, headaches and memory loss.  Hematological:  Positive for bruising/bleeding tendency.   Psychiatric/Behavioral:  Negative for confusion.    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   Malaise and fatigue 07/27/2018   Overweight 07/27/2018   Dyspnea 02/06/2018   Stress at home 02/06/2018   Primary osteoarthritis of both hips 12/30/2016   History of scoliosis 12/30/2016   Elevated LFTs 12/30/2016   Sicca syndrome, unspecified 09/02/2016   Rheumatoid arthritis of multiple sites without rheumatoid factor (HCC) 04/08/2016   High risk medication use 04/08/2016   Osteoarthritis, 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    Past Medical History:  Diagnosis Date   Diverticulitis    per patient    Rheumatoid arthritis (HCC)     Family History  Problem Relation Age of Onset   Hypertension Mother    Irritable bowel syndrome Mother    Stomach cancer Father    Lung cancer Father    Diverticulitis Father    Sleep apnea Sister    Ulcerative colitis Son    Cancer Paternal Grandmother    Diverticulitis Paternal Grandmother    Hodgkin's lymphoma Paternal Grandmother    Prostate cancer Other        Aunts grandfather on dad side    Bone cancer Other    Diverticulitis Paternal Aunt        had colon surgery and colostomy    Past Surgical History:  Procedure Laterality Date  COLONOSCOPY  10/19/2019   [facility]. Mild diverticulosis was noted throughout the entire examined colon. The colon musoca was otherwise normal. Retroflexed views revealed internal Grade II second degree hemorrhoids.    KNEE ARTHROSCOPY Bilateral    Social History   Social History Narrative   Not on file   Immunization History  Administered Date(s) Administered   Influenza-Unspecified 03/07/2014, 02/24/2015, 02/22/2016   Moderna Sars-Covid-2 Vaccination 06/13/2020    PFIZER(Purple Top)SARS-COV-2 Vaccination 08/16/2019, 09/10/2019     Objective: Vital Signs: BP 107/71 (BP Location: Left Arm, Patient Position: Sitting, Cuff Size: Normal)   Pulse 71   Resp 14   Ht 5\' 5"  (1.651 m)   Wt 155 lb 12.8 oz (70.7 kg)   BMI 25.93 kg/m    Physical Exam Vitals and nursing note reviewed.  Constitutional:      Appearance: She is well-developed.  HENT:     Head: Normocephalic and atraumatic.  Eyes:     Conjunctiva/sclera: Conjunctivae normal.  Cardiovascular:     Rate and Rhythm: Normal rate and regular rhythm.     Heart sounds: Normal heart sounds.  Pulmonary:     Effort: Pulmonary effort is normal.     Breath sounds: Normal breath sounds.  Abdominal:     General: Bowel sounds are normal.     Palpations: Abdomen is soft.  Musculoskeletal:     Cervical back: Normal range of motion.  Lymphadenopathy:     Cervical: No cervical adenopathy.  Skin:    General: Skin is warm and dry.     Capillary Refill: Capillary refill takes less than 2 seconds.  Neurological:     Mental Status: She is alert and oriented to person, place, and time.     Comments: Weakness of left dorsiflexors was noted.  The circumference of left calf is larger than the right calf.  Psychiatric:        Behavior: Behavior normal.     Musculoskeletal Exam: C-spine was in good range of motion.  She has thoracolumbar scoliosis.  She had no point tenderness on palpation.  Shoulder joints, elbow joints, wrist joints, MCPs PIPs and DIPs with good range of motion with no synovitis.  Hip joints, knee joints, ankles, MTPs and PIPs with good range of motion with no synovitis.  CDAI Exam: CDAI Score: 0.4  Patient Global: 2 mm; Provider Global: 2 mm Swollen: 0 ; Tender: 0  Joint Exam 11/26/2020   No joint exam has been documented for this visit   There is currently no information documented on the homunculus. Go to the Rheumatology activity and complete the homunculus joint  exam.  Investigation: No additional findings.  Imaging: No results found.  Recent Labs: Lab Results  Component Value Date   WBC 10.8 06/25/2020   HGB 13.6 06/25/2020   PLT 345 06/25/2020   NA 140 06/25/2020   K 4.8 06/25/2020   CL 102 06/25/2020   CO2 27 06/25/2020   GLUCOSE 93 06/25/2020   BUN 10 06/25/2020   CREATININE 0.50 06/25/2020   BILITOT 0.4 06/25/2020   ALKPHOS 88 02/27/2020   AST 19 06/25/2020   ALT 18 06/25/2020   PROT 7.4 06/25/2020   ALBUMIN 4.5 02/27/2020   CALCIUM 9.8 06/25/2020   GFRAA 134 06/25/2020   QFTBGOLDPLUS NEGATIVE 06/25/2020    Speciality Comments: No specialty comments available.  Procedures:  No procedures performed Allergies: Sulfa antibiotics, Factive [gemifloxacin], and Penicillins   Assessment / Plan:     Visit Diagnoses: Rheumatoid arthritis of multiple  sites without rheumatoid factor (HCC)-she had no synovitis on examination today.  She has been tolerating Enbrel well.  She has a spaced Enbrel to every other week now.  High risk medication use - Enbrel SureClick 50 mg sq injections every 14days.  Methotrexate discontinued due to elevated LFTs in June 2019.  Her LFTs are normal now.  Her labs are past due.  She is advised to get labs every 3 months.  We will check labs today.  Recommendations regarding the vaccination was placed in the AVS.  She was also advised to stop Enbrel in case she develops an infection and restart Enbrel once infection resolves.  She is also advised to have annual skin examination with a dermatologist to screen for nonmelanoma skin cancer.  Primary osteoarthritis of both hands-she continues to have some stiffness in her hands.  Primary osteoarthritis of both hips-she good range of motion in her hip joints.  Chronic midline low back pain with left-sided sciatica - She has scoliosis, multilevel spondylosis and facet joint arthropathy.  She states that the lower back pain resolved although she notices that muscle  mass in her left lower extremities more than the right lower extremity.    Left foot drop-weakness in her left foot dorsiflexors was noted.  I will refer her to Dr. Otelia Sergeant for evaluation.  History of scoliosis  Sicca syndrome (HCC)-over-the-counter products were discussed.  Diverticulitis - She was diagnosed with acute diverticulitis on 3/26/21and was treated outpatient with oral ciprofloxacin and flagyl.   Increased risk of heart disease with rheumatoid arthritis was discussed.  Dietary modifications and exercise were discussed and a handout was placed in the AVS.    Orders: Orders Placed This Encounter  Procedures   CBC with Differential/Platelet   COMPLETE METABOLIC PANEL WITH GFR   AMB referral to orthopedics    No orders of the defined types were placed in this encounter.   Follow-Up Instructions: Return in about 3 months (around 02/26/2021) for Rheumatoid arthritis.   Pollyann Savoy, MD  Note - This record has been created using Animal nutritionist.  Chart creation errors have been sought, but may not always  have been located. Such creation errors do not reflect on  the standard of medical care.

## 2020-11-26 ENCOUNTER — Ambulatory Visit: Payer: Managed Care, Other (non HMO) | Admitting: Rheumatology

## 2020-11-26 ENCOUNTER — Other Ambulatory Visit: Payer: Self-pay

## 2020-11-26 ENCOUNTER — Encounter: Payer: Self-pay | Admitting: Rheumatology

## 2020-11-26 VITALS — BP 107/71 | HR 71 | Resp 14 | Ht 65.0 in | Wt 155.8 lb

## 2020-11-26 DIAGNOSIS — Z79899 Other long term (current) drug therapy: Secondary | ICD-10-CM | POA: Diagnosis not present

## 2020-11-26 DIAGNOSIS — M3500 Sicca syndrome, unspecified: Secondary | ICD-10-CM | POA: Diagnosis not present

## 2020-11-26 DIAGNOSIS — M19041 Primary osteoarthritis, right hand: Secondary | ICD-10-CM | POA: Diagnosis not present

## 2020-11-26 DIAGNOSIS — M5442 Lumbago with sciatica, left side: Secondary | ICD-10-CM

## 2020-11-26 DIAGNOSIS — K5792 Diverticulitis of intestine, part unspecified, without perforation or abscess without bleeding: Secondary | ICD-10-CM

## 2020-11-26 DIAGNOSIS — G8929 Other chronic pain: Secondary | ICD-10-CM

## 2020-11-26 DIAGNOSIS — M19042 Primary osteoarthritis, left hand: Secondary | ICD-10-CM

## 2020-11-26 DIAGNOSIS — M0609 Rheumatoid arthritis without rheumatoid factor, multiple sites: Secondary | ICD-10-CM

## 2020-11-26 DIAGNOSIS — M21372 Foot drop, left foot: Secondary | ICD-10-CM

## 2020-11-26 DIAGNOSIS — M160 Bilateral primary osteoarthritis of hip: Secondary | ICD-10-CM

## 2020-11-26 DIAGNOSIS — Z8739 Personal history of other diseases of the musculoskeletal system and connective tissue: Secondary | ICD-10-CM

## 2020-11-26 NOTE — Patient Instructions (Addendum)
Standing Labs We placed an order today for your standing lab work.   Please have your standing labs drawn in October and every 3 months  If possible, please have your labs drawn 2 weeks prior to your appointment so that the provider can discuss your results at your appointment.  Please note that you may see your imaging and lab results in MyChart before we have reviewed them. We may be awaiting multiple results to interpret others before contacting you. Please allow our office up to 72 hours to thoroughly review all of the results before contacting the office for clarification of your results.  We have open lab daily: Monday through Thursday from 1:30-4:30 PM and Friday from 1:30-4:00 PM at the office of Dr. Pollyann Savoy, [facility].   Please be advised, all patients with office appointments requiring lab work will take precedent over walk-in lab work.  If possible, please come for your lab work on Monday and Friday afternoons, as you may experience shorter wait times. The office is located at [address], [place], [place] [address] No appointment is necessary.   Labs are drawn by [facility]. Please bring your co-pay at the time of your lab draw.  You may receive a bill from [facility] for your lab work.  If you wish to have your labs drawn at another location, please call the office 24 hours in advance to send orders.  If you have any questions regarding directions or hours of operation,  please call [phone].   As a reminder, please drink plenty of water prior to coming for your lab work. Thanks!   Please get annual skin examination to screen for nonmelanoma skin cancer while you are on Enbrel  Vaccines You are taking a medication(s) that can suppress your immune system.  The following immunizations are recommended: Flu annually Covid-19  Td/Tdap (tetanus, diphtheria, pertussis) every 10 years Pneumonia (Prevnar 15 then Pneumovax 23 at least 1 year apart.   Alternatively, can take Prevnar 20 without needing additional dose) Shingrix (after age 70): 2 doses from 4 weeks to 6 months apart  Please check with your PCP to make sure you are up to date.   If you test POSITIVE for COVID19 and have MILD to MODERATE symptoms: First, call your PCP if you would like to receive COVID19 treatment AND Hold your medications during the infection and for at least 1 week after your symptoms have resolved: Injectable medication (Benlysta, Cimzia, Cosentyx, Enbrel, Humira, Orencia, Remicade, Simponi, Stelara, Taltz, Tremfya) Methotrexate Leflunomide (Arava) Mycophenolate (Cellcept) Harriette Ohara, Olumiant, or Rinvoq If you take Actemra or Kevzara, you DO NOT need to hold these for COVID19 infection.  If you test POSITIVE for COVID19 and have NO symptoms: First, call your PCP if you would like to receive COVID19 treatment AND Hold your medications for at least 10 days after the day that you tested positive Injectable medication (Benlysta, Cimzia, Cosentyx, Enbrel, Humira, Orencia, Remicade, Simponi, Stelara, Taltz, Tremfya) Methotrexate Leflunomide (Arava) Mycophenolate (Cellcept) Harriette Ohara, Olumiant, or Rinvoq If you take Actemra or Kevzara, you DO NOT need to hold these for COVID19 infection.  If you have signs or symptoms of an infection or start antibiotics: First, call your PCP for workup of your infection. Hold your medication through the infection, until you complete your antibiotics, and until symptoms resolve if you take the following: Injectable medication (Actemra, Benlysta, Cimzia, Cosentyx, Enbrel, Humira, Kevzara, Orencia, Remicade, Simponi, Stelara, Taltz, Tremfya) Methotrexate Leflunomide (Arava) Mycophenolate (Cellcept) Harriette Ohara, Olumiant, or Rinvoq  Heart Disease Prevention   Your inflammatory disease increases your risk of heart disease which includes heart attack, stroke, atrial fibrillation (irregular heartbeats), high blood pressure, heart  failure and atherosclerosis (plaque in the arteries).  It is important to reduce your risk by:   Keep blood pressure, cholesterol, and blood sugar at healthy levels   Smoking Cessation   Maintain a healthy weight  BMI 20-25   Eat a healthy diet  Plenty of fresh fruit, vegetables, and whole grains  Limit saturated fats, foods high in sodium, and added sugars  DASH and Mediterranean diet   Increase physical activity  Recommend moderate physically activity for 150 minutes per week/ 30 minutes a day for five days a week These can be broken up into three separate ten-minute sessions during the day.   Reduce Stress  Meditation, slow breathing exercises, yoga, coloring books  Dental visits twice a year

## 2020-11-27 LAB — COMPLETE METABOLIC PANEL WITH GFR
AG Ratio: 1.6 (calc) (ref 1.0–2.5)
ALT: 14 U/L (ref 6–29)
AST: 19 U/L (ref 10–35)
Albumin: 4.3 g/dL (ref 3.6–5.1)
Alkaline phosphatase (APISO): 56 U/L (ref 31–125)
BUN: 13 mg/dL (ref 7–25)
CO2: 28 mmol/L (ref 20–32)
Calcium: 9.3 mg/dL (ref 8.6–10.2)
Chloride: 103 mmol/L (ref 98–110)
Creat: 0.57 mg/dL (ref 0.50–1.10)
GFR, Est African American: 127 mL/min/{1.73_m2} (ref >=60)
GFR, Est Non African American: 110 mL/min/{1.73_m2} (ref >=60)
Globulin: 2.7 g/dL (calc) (ref 1.9–3.7)
Glucose, Bld: 86 mg/dL (ref 65–99)
Potassium: 4 mmol/L (ref 3.5–5.3)
Sodium: 138 mmol/L (ref 135–146)
Total Bilirubin: 0.5 mg/dL (ref 0.2–1.2)
Total Protein: 7 g/dL (ref 6.1–8.1)

## 2020-11-27 LAB — CBC WITH DIFFERENTIAL/PLATELET
Absolute Monocytes: 510 cells/uL (ref 200–950)
Basophils Absolute: 26 cells/uL (ref 0–200)
Basophils Relative: 0.3 %
Eosinophils Absolute: 158 cells/uL (ref 15–500)
Eosinophils Relative: 1.8 %
HCT: 39.9 % (ref 35.0–45.0)
Hemoglobin: 13 g/dL (ref 11.7–15.5)
Lymphs Abs: 1698 cells/uL (ref 850–3900)
MCH: 29 pg (ref 27.0–33.0)
MCHC: 32.6 g/dL (ref 32.0–36.0)
MCV: 89.1 fL (ref 80.0–100.0)
MPV: 10.3 fL (ref 7.5–12.5)
Monocytes Relative: 5.8 %
Neutro Abs: 6406 cells/uL (ref 1500–7800)
Neutrophils Relative %: 72.8 %
Platelets: 295 10*3/uL (ref 140–400)
RBC: 4.48 10*6/uL (ref 3.80–5.10)
RDW: 11.8 % (ref 11.0–15.0)
Total Lymphocyte: 19.3 %
WBC: 8.8 10*3/uL (ref 3.8–10.8)

## 2020-11-27 NOTE — Progress Notes (Signed)
CBC and CMP are normal.

## 2020-12-31 ENCOUNTER — Ambulatory Visit: Payer: Managed Care, Other (non HMO) | Admitting: Specialist

## 2020-12-31 ENCOUNTER — Encounter: Payer: Self-pay | Admitting: Specialist

## 2020-12-31 ENCOUNTER — Other Ambulatory Visit: Payer: Self-pay

## 2020-12-31 ENCOUNTER — Ambulatory Visit: Payer: Self-pay

## 2020-12-31 VITALS — BP 117/81 | HR 64 | Ht 65.0 in | Wt 156.0 lb

## 2020-12-31 DIAGNOSIS — M5416 Radiculopathy, lumbar region: Secondary | ICD-10-CM

## 2020-12-31 DIAGNOSIS — M21372 Foot drop, left foot: Secondary | ICD-10-CM

## 2020-12-31 DIAGNOSIS — I872 Venous insufficiency (chronic) (peripheral): Secondary | ICD-10-CM

## 2020-12-31 DIAGNOSIS — M545 Low back pain, unspecified: Secondary | ICD-10-CM | POA: Diagnosis not present

## 2020-12-31 NOTE — Patient Instructions (Signed)
Avoid frequent bending and stooping  No lifting greater than 10 lbs. May use ice or moist heat for pain. Weight loss is of benefit. Best medication for lumbar disc disease is arthritis medications like motrin, celebrex and naprosyn. Exercise is important to improve your indurance and does allow people to function better inspite of back pain. Baby aspirin for venous insufficiency. Invest in compression hose or stockings for decreasing tendency for the left leg to swell. Expect strengthening and exercises to help with return of left leg strength. A one lb. weight for left ankle and theraband exercises help. The nerve grows back 1 mm per day and may take 1-2 years to return to the left leg muscles.

## 2020-12-31 NOTE — Progress Notes (Signed)
Office Visit Note   Patient: Michelle Pierce           Date of Birth: 23-Feb-1971           MRN: [id number] Visit Date: 12/31/2020              Requested by: Pollyann Savoy, MD [address] [place],  [place] [address] PCP: Gordan Payment., MD   Assessment & Plan: Visit Diagnoses:  1. Low back pain, unspecified back pain laterality, unspecified chronicity, unspecified whether sciatica present   2. Left foot drop   3. Chronic lumbar radiculopathy   4. Venous insufficiency (chronic) (peripheral)     Plan: Avoid frequent bending and stooping  No lifting greater than 10 lbs. May use ice or moist heat for pain. Weight loss is of benefit. Best medication for lumbar disc disease is arthritis medications like motrin, celebrex and naprosyn. Exercise is important to improve your indurance and does allow people to function better inspite of back pain. Baby aspirin for venous insufficiency. Invest in compression hose or stockings for decreasing tendency for the left leg to swell. Expect strengthening and exercises to help with return of left leg strength. A one lb. weight for left ankle and theraband exercises help. The nerve grows back 1 mm per day and may take 1-2 years to return to the left leg muscles.  Follow-Up Instructions: Return in 4 weeks (on 01/28/2021), or if symptoms worsen or fail to improve.   Orders:  Orders Placed This Encounter  Procedures   XR Lumb Spine Flex&Ext Only   No orders of the defined types were placed in this encounter.     Procedures: No procedures performed   Clinical Data: No additional findings.   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 She had an episode of left leg pain with sciatica into the left buttock and left hip laterally and then shot into the left leg. She reports it occurred when she was  waitressing and bending and sweeping under a table. She had left leg severe pain and saw her primary care and did PT and this improved. She not hurting as she did then but notices persistent weakness. History of previous thoracolumbar scoliosis, history of seeing DC for back and for HAs and stress.  She can walk a mile. Children 22 and 17.  No bowel or bladder difficulty. Dr. Corliss Skains saw her for RA and referred her for assessment of left foot and back.   Review of Systems  Constitutional: Negative.  Negative for activity change, appetite change, chills, diaphoresis, fatigue, fever and unexpected weight change.  HENT: Negative.  Negative for congestion, dental problem, drooling, ear discharge, ear pain, facial swelling, hearing loss, mouth sores, nosebleeds, postnasal drip, rhinorrhea and sinus pain.   Eyes:  Positive for itching and visual disturbance. Negative for photophobia, pain, discharge and redness.  Respiratory: Negative.  Negative for apnea, cough, choking, chest tightness, shortness of breath, wheezing and stridor.   Cardiovascular: Negative.  Negative for chest pain, palpitations and leg swelling.  Gastrointestinal: Negative.  Negative for abdominal distention, abdominal pain, anal bleeding, blood in stool, constipation, diarrhea, nausea, rectal pain and vomiting.  Endocrine: Negative.  Negative for cold intolerance, heat intolerance, polydipsia, polyphagia and polyuria.  Genitourinary: Negative.  Negative for difficulty urinating, dyspareunia, dysuria, enuresis, flank pain and frequency.  Musculoskeletal: Negative.  Skin: Negative.  Negative for color change, pallor, rash and wound.  Allergic/Immunologic: Negative.  Negative for environmental allergies, food allergies and immunocompromised state.  Neurological:  Positive for weakness and numbness. Negative for dizziness, tremors, seizures, syncope, facial asymmetry, speech difficulty, light-headedness and headaches.  Hematological:  Negative.  Negative for adenopathy. Does not bruise/bleed easily.  Psychiatric/Behavioral: Negative.  Negative for agitation, behavioral problems, confusion, decreased concentration, dysphoric mood, hallucinations, self-injury, sleep disturbance and suicidal ideas. The patient is not nervous/anxious and is not hyperactive.     Objective: Vital Signs: BP 117/81 (BP Location: Left Arm, Patient Position: Sitting)   Pulse 64   Ht 5\' 5"  (1.651 m)   Wt 156 lb (70.8 kg)   BMI 25.96 kg/m   Physical Exam Constitutional:      Appearance: She is well-developed.  HENT:     Head: Normocephalic and atraumatic.  Eyes:     Pupils: Pupils are equal, round, and reactive to light.  Pulmonary:     Effort: Pulmonary effort is normal.     Breath sounds: Normal breath sounds.  Abdominal:     General: Bowel sounds are normal.     Palpations: Abdomen is soft.  Musculoskeletal:        General: Normal range of motion.     Cervical back: Normal range of motion and neck supple.  Skin:    General: Skin is warm and dry.  Neurological:     Mental Status: She is alert and oriented to person, place, and time.  Psychiatric:        Behavior: Behavior normal.        Thought Content: Thought content normal.        Judgment: Judgment normal.    Ortho Exam  Specialty Comments:  No specialty comments available.  Imaging: No results found.   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   Attention deficit hyperactivity disorder (ADHD), predominantly inattentive type 07/27/2018   Malaise and fatigue 07/27/2018   Overweight 07/27/2018   Dyspnea 02/06/2018   Stress at home 02/06/2018   Primary osteoarthritis of both hips 12/30/2016   History of scoliosis 12/30/2016   Elevated LFTs 12/30/2016   Sicca syndrome, unspecified 09/02/2016    Rheumatoid arthritis of multiple sites without rheumatoid factor (HCC) 04/08/2016   High risk medication use 04/08/2016   Osteoarthritis, 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   Past Medical History:  Diagnosis Date   Diverticulitis    per patient    Rheumatoid arthritis (HCC)     Family History  Problem Relation Age of Onset   Hypertension Mother    Irritable bowel syndrome Mother    Stomach cancer Father    Lung cancer Father    Diverticulitis Father    Sleep apnea Sister    Ulcerative colitis Son    Cancer Paternal Grandmother    Diverticulitis Paternal Grandmother    Hodgkin's lymphoma Paternal Grandmother    Prostate cancer Other        Aunts grandfather on dad side    Bone cancer Other    Diverticulitis Paternal Aunt        had colon surgery and colostomy     Past Surgical History:  Procedure Laterality Date   COLONOSCOPY  10/19/2019   [facility]. Mild diverticulosis was noted throughout the entire examined colon. The colon musoca was otherwise normal. Retroflexed views  revealed internal Grade II second degree hemorrhoids.    KNEE ARTHROSCOPY Bilateral    Social History   Occupational History   Not on file  Tobacco Use   Smoking status: Never   Smokeless tobacco: Never  Vaping Use   Vaping Use: Never used  Substance and Sexual Activity   Alcohol use: No   Drug use: No   Sexual activity: Yes    Partners: Male    Birth control/protection: Other-see comments    Comment: Partner had vasectomy

## 2021-01-05 ENCOUNTER — Ambulatory Visit: Payer: Managed Care, Other (non HMO) | Admitting: Specialist

## 2021-01-12 ENCOUNTER — Other Ambulatory Visit: Payer: Self-pay | Admitting: Physician Assistant

## 2021-01-12 NOTE — Telephone Encounter (Signed)
Next Visit: 02/26/2021  Last Visit: 11/26/2020  Last Fill: 02/04/2020  [id number] arthritis of multiple sites without rheumatoid factor   Current Dose per office note 11/26/2020: Enbrel SureClick 50 mg sq injections every 14days  Labs: 11/26/2020 CBC and CMP are normal.  TB Gold: 06/25/2020 Neg    Okay to refill Enbrel?

## 2021-01-29 ENCOUNTER — Other Ambulatory Visit: Payer: Self-pay

## 2021-01-29 MED ORDER — ENBREL SURECLICK 50 MG/ML ~~LOC~~ SOAJ: 50.0000 mg | SUBCUTANEOUS | 2 refills | Status: DC

## 2021-01-29 NOTE — Telephone Encounter (Signed)
Aram Beecham from [facility] left a voicemail regarding the prescription for Enbrel.  She states they received a 56 day supply of Enbrel, however, the plan only allows a 30 day supply.  Please resubmit the prescription with the correct supply that was approved from the PA.  To update the PA call #[phone].

## 2021-01-31 ENCOUNTER — Other Ambulatory Visit: Payer: Self-pay | Admitting: Rheumatology

## 2021-02-13 ENCOUNTER — Other Ambulatory Visit: Payer: Self-pay | Admitting: Rheumatology

## 2021-02-13 NOTE — Progress Notes (Signed)
Office Visit Note  Patient: Michelle Pierce             Date of Birth: 1971-01-28           MRN: [id number]             PCP: Gordan Payment., MD Referring: Gordan Payment., MD Visit Date: 02/26/2021 Occupation: @GUAROCC @  Subjective:  Pain in hands.   History of Present Illness: Michelle Pierce is a 50 y.o. female with a history of sero- negative rheumatoid arthritis and osteoarthritis.  She states recently she has been experiencing increasing stiffness and discomfort in her hands.  She has been working double shifts.  She has not noticed any joint swelling.  She has been taking Enbrel injections every 14 days.  She states she starts hurting towards the end.  She denies any lower back discomfort today.  Activities of Daily Living:  Patient reports morning stiffness for 0 minutes.   Patient Denies nocturnal pain.  Difficulty dressing/grooming: Denies Difficulty climbing stairs: Denies Difficulty getting out of chair: Denies Difficulty using hands for taps, buttons, cutlery, and/or writing: Denies  Review of Systems  Constitutional:  Positive for fatigue.  HENT:  Positive for mouth dryness and nose dryness. Negative for mouth sores.   Eyes:  Positive for dryness. Negative for pain and itching.  Respiratory:  Negative for shortness of breath and difficulty breathing.   Cardiovascular:  Negative for chest pain and palpitations.  Gastrointestinal:  Negative for blood in stool, constipation and diarrhea.  Endocrine: Negative for increased urination.  Genitourinary:  Negative for difficulty urinating.  Musculoskeletal:  Positive for joint pain and joint pain. Negative for joint swelling, myalgias, morning stiffness, muscle tenderness and myalgias.  Skin:  Negative for color change, rash and redness.  Allergic/Immunologic: Negative for susceptible to infections.  Neurological:  Negative for dizziness, numbness, memory loss and weakness.  Hematological:  Negative for  bruising/bleeding tendency.  Psychiatric/Behavioral:  Negative for confusion.    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   Malaise and fatigue 07/27/2018   Overweight 07/27/2018   Dyspnea 02/06/2018   Stress at home 02/06/2018   Primary osteoarthritis of both hips 12/30/2016   History of scoliosis 12/30/2016   Elevated LFTs 12/30/2016   Sicca syndrome, unspecified 09/02/2016   Rheumatoid arthritis of multiple sites without rheumatoid factor (HCC) 04/08/2016   High risk medication use 04/08/2016   Osteoarthritis, 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    Past Medical History:  Diagnosis Date   Diverticulitis    per patient    Rheumatoid arthritis (HCC)     Family History  Problem Relation Age of Onset   Hypertension Mother    Irritable bowel syndrome Mother    Stomach cancer Father    Lung cancer Father    Diverticulitis Father    Sleep apnea Sister    Ulcerative colitis Son    Cancer Paternal Grandmother    Diverticulitis Paternal Grandmother    Hodgkin's lymphoma Paternal Grandmother    Prostate cancer Other        Aunts grandfather on dad side    Bone cancer Other    Diverticulitis Paternal Aunt        had colon surgery and colostomy    Past Surgical History:  Procedure Laterality  Date   COLONOSCOPY  10/19/2019   [facility]. Mild diverticulosis was noted throughout the entire examined colon. The colon musoca was otherwise normal. Retroflexed views revealed internal Grade II second degree hemorrhoids.    KNEE ARTHROSCOPY Bilateral    Social History   Social History Narrative   Not on file   Immunization History  Administered Date(s) Administered   Influenza-Unspecified 03/07/2014, 02/24/2015, 02/22/2016   Moderna  Sars-Covid-2 Vaccination 06/13/2020   PFIZER(Purple Top)SARS-COV-2 Vaccination 08/16/2019, 09/10/2019     Objective: Vital Signs: BP 124/79 (BP Location: Left Arm, Patient Position: Sitting, Cuff Size: Normal)   Pulse 74   Ht 5\' 5"  (1.651 m)   Wt 154 lb 6.4 oz (70 kg)   BMI 25.69 kg/m    Physical Exam Vitals and nursing note reviewed.  Constitutional:      Appearance: She is well-developed.  HENT:     Head: Normocephalic and atraumatic.  Eyes:     Conjunctiva/sclera: Conjunctivae normal.  Cardiovascular:     Rate and Rhythm: Normal rate and regular rhythm.     Heart sounds: Normal heart sounds.  Pulmonary:     Effort: Pulmonary effort is normal.     Breath sounds: Normal breath sounds.  Abdominal:     General: Bowel sounds are normal.     Palpations: Abdomen is soft.  Musculoskeletal:     Cervical back: Normal range of motion.  Lymphadenopathy:     Cervical: No cervical adenopathy.  Skin:    General: Skin is warm and dry.     Capillary Refill: Capillary refill takes less than 2 seconds.  Neurological:     Mental Status: She is alert and oriented to person, place, and time.  Psychiatric:        Behavior: Behavior normal.     Musculoskeletal Exam: C-spine was in good range of motion.  Shoulder joints, elbow joints, wrist joints, MCPs PIPs and DIPs with good range of motion.  No synovitis was noted.  She had bilateral PIP and DIP thickening.  Hip joints, knee joints, ankles, MTPs and PIPs with good range of motion with no synovitis. CDAI Exam: CDAI Score: 0.6  Patient Global: 3 mm; Provider Global: 3 mm Swollen: 0 ; Tender: 0  Joint Exam 02/26/2021   No joint exam has been documented for this visit   There is currently no information documented on the homunculus. Go to the Rheumatology activity and complete the homunculus joint exam.  Investigation: No additional findings.  Imaging: No results found.  Recent Labs: Lab Results  Component Value Date   WBC 8.8  11/26/2020   HGB 13.0 11/26/2020   PLT 295 11/26/2020   NA 138 11/26/2020   K 4.0 11/26/2020   CL 103 11/26/2020   CO2 28 11/26/2020   GLUCOSE 86 11/26/2020   BUN 13 11/26/2020   CREATININE 0.57 11/26/2020   BILITOT 0.5 11/26/2020   ALKPHOS 88 02/27/2020   AST 19 11/26/2020   ALT 14 11/26/2020   PROT 7.0 11/26/2020   ALBUMIN 4.5 02/27/2020   CALCIUM 9.3 11/26/2020   GFRAA 127 11/26/2020   QFTBGOLDPLUS NEGATIVE 06/25/2020    Speciality Comments: No specialty comments available.  Procedures:  No procedures performed Allergies: Sulfa antibiotics, Factive [gemifloxacin], and Penicillins   Assessment / Plan:     Visit Diagnoses: Rheumatoid arthritis of multiple sites without rheumatoid factor (HCC)-she has been doing well on Enbrel every other week.  She states towards the end she starts experiencing some neck stiffness.  We  discussed decreasing the interval to every 12 days.  I did not find any synovitis on examination.  I believe some of the stiffness is coming from osteoarthritis.  High risk medication use - Enbrel SureClick 50 mg sq injections every 14days.  Methotrexate discontinued due to elevated LFTs in June 2019.  -Her last labs from November 26, 2020 were normal.  We will check labs today and then every 3 months to monitor for drug toxicity.  Last TB gold was June 25, 2020.  Plan: CBC with Differential/Platelet, COMPLETE METABOLIC PANEL WITH GFR, CBC with Differential/Platelet, COMPLETE METABOLIC PANEL WITH GFR, QuantiFERON-TB Gold Plus.  She has been advised to get a annual skin examination to screen for skin cancer while she is on Enbrel.  She is also advised to stop Enbrel in case she develops an infection.  Updated information regarding realization was also placed in the AVS.  Primary osteoarthritis of both hands-she has DIP and PIP thickening.  Joint protection muscle strengthening was discussed.  Primary osteoarthritis of both hips-she had good range of motion without  discomfort.  Chronic midline low back pain with left-sided sciatica-she denies any lower back pain today.  Left foot drop-related to DDD lumbar spine.  History of scoliosis  Sicca syndrome (HCC)-manageable with over-the-counter products.  Diverticulitis - She was diagnosed with acute diverticulitis on 3/26/21and was treated outpatient with oral ciprofloxacin and flagyl.   Orders: Orders Placed This Encounter  Procedures   CBC with Differential/Platelet   COMPLETE METABOLIC PANEL WITH GFR   CBC with Differential/Platelet   COMPLETE METABOLIC PANEL WITH GFR   QuantiFERON-TB Gold Plus    No orders of the defined types were placed in this encounter.    Follow-Up Instructions: Return in about 5 months (around 07/27/2021) for Rheumatoid arthritis, Osteoarthritis.   Pollyann Savoy, MD  Note - This record has been created using [employer].  Chart creation errors have been sought, but may not always  have been located. Such creation errors do not reflect on  the standard of medical care.

## 2021-02-26 ENCOUNTER — Ambulatory Visit: Payer: Managed Care, Other (non HMO) | Admitting: Rheumatology

## 2021-02-26 ENCOUNTER — Other Ambulatory Visit: Payer: Self-pay

## 2021-02-26 ENCOUNTER — Encounter: Payer: Self-pay | Admitting: Rheumatology

## 2021-02-26 VITALS — BP 124/79 | HR 74 | Ht 65.0 in | Wt 154.4 lb

## 2021-02-26 DIAGNOSIS — M21372 Foot drop, left foot: Secondary | ICD-10-CM

## 2021-02-26 DIAGNOSIS — M0609 Rheumatoid arthritis without rheumatoid factor, multiple sites: Secondary | ICD-10-CM | POA: Diagnosis not present

## 2021-02-26 DIAGNOSIS — Z79899 Other long term (current) drug therapy: Secondary | ICD-10-CM

## 2021-02-26 DIAGNOSIS — M3500 Sicca syndrome, unspecified: Secondary | ICD-10-CM

## 2021-02-26 DIAGNOSIS — M19042 Primary osteoarthritis, left hand: Secondary | ICD-10-CM

## 2021-02-26 DIAGNOSIS — M160 Bilateral primary osteoarthritis of hip: Secondary | ICD-10-CM

## 2021-02-26 DIAGNOSIS — K5792 Diverticulitis of intestine, part unspecified, without perforation or abscess without bleeding: Secondary | ICD-10-CM

## 2021-02-26 DIAGNOSIS — G8929 Other chronic pain: Secondary | ICD-10-CM

## 2021-02-26 DIAGNOSIS — M19041 Primary osteoarthritis, right hand: Secondary | ICD-10-CM

## 2021-02-26 DIAGNOSIS — Z8739 Personal history of other diseases of the musculoskeletal system and connective tissue: Secondary | ICD-10-CM

## 2021-02-26 DIAGNOSIS — M5442 Lumbago with sciatica, left side: Secondary | ICD-10-CM

## 2021-02-26 NOTE — Patient Instructions (Addendum)
Standing Labs We placed an order today for your standing lab work.   Please have your standing labs drawn in January and every 3 months Please get TB Gold with your January labs  If possible, please have your labs drawn 2 weeks prior to your appointment so that the provider can discuss your results at your appointment.  Please note that you may see your imaging and lab results in MyChart before we have reviewed them. We may be awaiting multiple results to interpret others before contacting you. Please allow our office up to 72 hours to thoroughly review all of the results before contacting the office for clarification of your results.  We have open lab daily: Monday through Thursday from 1:30-4:30 PM and Friday from 1:30-4:00 PM at the office of Dr. Pollyann Savoy, [facility].   Please be advised, all patients with office appointments requiring lab work will take precedent over walk-in lab work.  If possible, please come for your lab work on Monday and Friday afternoons, as you may experience shorter wait times. The office is located at [address], [address], [place], [place] [address] No appointment is necessary.   Labs are drawn by [facility]. Please bring your co-pay at the time of your lab draw.  You may receive a bill from [facility] for your lab work.  If you wish to have your labs drawn at another location, please call the office 24 hours in advance to send orders.  If you have any questions regarding directions or hours of operation,  please call [phone].   As a reminder, please drink plenty of water prior to coming for your lab work. Thanks!   Vaccines You are taking a medication(s) that can suppress your immune system.  The following immunizations are recommended: Flu annually Covid-19  Td/Tdap (tetanus, diphtheria, pertussis) every 10 years Pneumonia (Prevnar 15 then Pneumovax 23 at least 1 year apart.  Alternatively, can take Prevnar 20 without needing  additional dose) Shingrix: 2 doses from 4 weeks to 6 months apart  Please check with your PCP to make sure you are up to date.  If you have signs or symptoms of an infection or start antibiotics: First, call your PCP for workup of your infection. Hold your medication through the infection, until you complete your antibiotics, and until symptoms resolve if you take the following: Injectable medication (Actemra, Benlysta, Cimzia, Cosentyx, Enbrel, Humira, Kevzara, Orencia, Remicade, Simponi, Stelara, Taltz, Tremfya) Methotrexate Leflunomide (Arava) Mycophenolate (Cellcept) [place]borne [place], or Rinvoq  Please get annual skin examination by dermatologist to screen for nonmelanoma skin cancer while you are on Enbrel

## 2021-02-27 LAB — COMPLETE METABOLIC PANEL WITH GFR
AG Ratio: 1.6 (calc) (ref 1.0–2.5)
ALT: 15 U/L (ref 6–29)
AST: 19 U/L (ref 10–35)
Albumin: 4.5 g/dL (ref 3.6–5.1)
Alkaline phosphatase (APISO): 65 U/L (ref 31–125)
BUN/Creatinine Ratio: 19 (calc) (ref 6–22)
BUN: 9 mg/dL (ref 7–25)
CO2: 27 mmol/L (ref 20–32)
Calcium: 9.4 mg/dL (ref 8.6–10.2)
Chloride: 103 mmol/L (ref 98–110)
Creat: 0.48 mg/dL — ABNORMAL LOW (ref 0.50–0.99)
Globulin: 2.8 g/dL (calc) (ref 1.9–3.7)
Glucose, Bld: 102 mg/dL — ABNORMAL HIGH (ref 65–99)
Potassium: 4.5 mmol/L (ref 3.5–5.3)
Sodium: 139 mmol/L (ref 135–146)
Total Bilirubin: 0.5 mg/dL (ref 0.2–1.2)
Total Protein: 7.3 g/dL (ref 6.1–8.1)
eGFR: 117 mL/min/{1.73_m2} (ref >=60)

## 2021-02-27 LAB — CBC WITH DIFFERENTIAL/PLATELET
Absolute Monocytes: 421 cells/uL (ref 200–950)
Basophils Absolute: 28 cells/uL (ref 0–200)
Basophils Relative: 0.4 %
Eosinophils Absolute: 173 cells/uL (ref 15–500)
Eosinophils Relative: 2.5 %
HCT: 40.8 % (ref 35.0–45.0)
Hemoglobin: 13.7 g/dL (ref 11.7–15.5)
Lymphs Abs: 1822 cells/uL (ref 850–3900)
MCH: 29.3 pg (ref 27.0–33.0)
MCHC: 33.6 g/dL (ref 32.0–36.0)
MCV: 87.4 fL (ref 80.0–100.0)
MPV: 10.2 fL (ref 7.5–12.5)
Monocytes Relative: 6.1 %
Neutro Abs: 4457 cells/uL (ref 1500–7800)
Neutrophils Relative %: 64.6 %
Platelets: 279 10*3/uL (ref 140–400)
RBC: 4.67 10*6/uL (ref 3.80–5.10)
RDW: 11.7 % (ref 11.0–15.0)
Total Lymphocyte: 26.4 %
WBC: 6.9 10*3/uL (ref 3.8–10.8)

## 2021-02-27 NOTE — Progress Notes (Signed)
CBC and CMP within normal limits.

## 2021-06-16 ENCOUNTER — Telehealth: Payer: Self-pay | Admitting: Pharmacist

## 2021-06-16 NOTE — Telephone Encounter (Signed)
Received fax from [facility] of clinical questions for PA renewal for Enbrel Sureclick. Completed form and faxed with OV notes.  Fax: [phone] Phone: [phone] Case ID: [id number]  Chesley Mires, PharmD, MPH, BCPS Clinical Pharmacist (Rheumatology and Pulmonology)

## 2021-06-17 NOTE — Telephone Encounter (Signed)
Received notification from [facility] regarding a prior authorization for ENBREL. Authorization has been APPROVED from 06/16/21 to 06/16/22.   Patient must continue to fill through [facility]: [phone]  Authorization # [id number]  Chesley Mires, PharmD, MPH, BCPS Clinical Pharmacist (Rheumatology and Pulmonology)

## 2021-07-16 NOTE — Progress Notes (Deleted)
? ?Office Visit Note ? ?Patient: Michelle Pierce             ?Date of Birth: 06-16-1970           ?MRN: [id number]             ?PCP: Raina Mina., MD ?Referring: Raina Mina., MD ?Visit Date: 07/30/2021 ?Occupation: @GUAROCC @ ? ?Subjective:  ?No chief complaint on file. ? ? ?History of Present Illness: Michelle Pierce is a 51 y.o. female ***  ? ?Activities of Daily Living:  ?Patient reports morning stiffness for *** {minute/hour:19697}.   ?Patient {ACTIONS;DENIES/REPORTS:21021675::"Denies"} nocturnal pain.  ?Difficulty dressing/grooming: {ACTIONS;DENIES/REPORTS:21021675::"Denies"} ?Difficulty climbing stairs: {ACTIONS;DENIES/REPORTS:21021675::"Denies"} ?Difficulty getting out of chair: {ACTIONS;DENIES/REPORTS:21021675::"Denies"} ?Difficulty using hands for taps, buttons, cutlery, and/or writing: {ACTIONS;DENIES/REPORTS:21021675::"Denies"} ? ?No Rheumatology ROS completed.  ? ?PMFS History:  ?Patient Active Problem List  ? Diagnosis Date Noted  ? Diverticulitis 09/05/2019  ? Juvenile rheumatoid arthritis ([place]) 12/27/2018  ? Anxiety disorder 07/27/2018  ? 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 Malaise and fatigue 07/27/2018  ? Overweight 07/27/2018  ? Dyspnea 02/06/2018  ? Stress at home 02/06/2018  ? Primary osteoarthritis of both hips 12/30/2016  ? History of scoliosis 12/30/2016  ? Elevated LFTs 12/30/2016  ? Sicca syndrome, unspecified 09/02/2016  ? Rheumatoid arthritis of multiple sites without rheumatoid factor ([place]) 04/08/2016  ? High risk medication use 04/08/2016  ? 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  ?Past Medical History:  ?Diagnosis Date  ? Diverticulitis   ? per patient   ? Rheumatoid arthritis ([place])   ?  ?Family History  ?Problem Relation Age of Onset  ? Hypertension Mother   ? Irritable bowel syndrome Mother   ? Stomach cancer Father   ? Lung cancer Father   ? Diverticulitis Father   ? Sleep apnea Sister    ? Ulcerative colitis Son   ? Cancer Paternal Grandmother   ? Diverticulitis Paternal Grandmother   ? Hodgkin's lymphoma Paternal Grandmother   ? Prostate cancer Other   ?     Aunts grandfather on dad side   ? Bone cancer Other   ? Diverticulitis Paternal Aunt   ?     had colon surgery and colostomy   ? ?Past Surgical History:  ?Procedure Laterality Date  ? COLONOSCOPY  10/19/2019  ? [facility]. Mild diverticulosis was noted throughout the entire examined colon. The colon musoca was otherwise normal. Retroflexed views revealed internal Grade II second degree hemorrhoids.   ? KNEE ARTHROSCOPY Bilateral   ? ?Social History  ? ?Social History Narrative  ? Not on file  ? ?Immunization History  ?Administered Date(s) Administered  ? Influenza-Unspecified 03/07/2014, 02/24/2015, 02/22/2016  ? Moderna Sars-Covid-2 Vaccination 06/13/2020  ? PFIZER(Purple Top)SARS-COV-2 Vaccination 08/16/2019, 09/10/2019  ?  ? ?Objective: ?Vital Signs: There were no vitals taken for this visit.  ? ?Physical Exam  ? ?Musculoskeletal Exam: *** ? ?CDAI Exam: ?CDAI Score: -- ?Patient Global: --; Provider Global: -- ?Swollen: --; Tender: -- ?Joint Exam 07/30/2021  ? ?No joint exam has been documented for this visit  ? ?There is currently no information documented on the homunculus. Go to the Rheumatology activity and complete the homunculus joint exam. ? ?Investigation: ?No additional findings. ? ?Imaging: ?No results found. ? ?Recent Labs: ?Lab Results  ?Component Value Date  ? WBC 6.9 02/26/2021  ? HGB 13.7 02/26/2021  ? PLT 279 02/26/2021  ? NA 139 02/26/2021  ? K 4.5 02/26/2021  ?  CL 103 02/26/2021  ? CO2 27 02/26/2021  ? GLUCOSE 102 (H) 02/26/2021  ? BUN 9 02/26/2021  ? CREATININE 0.48 (L) 02/26/2021  ? BILITOT 0.5 02/26/2021  ? ALKPHOS 88 02/27/2020  ? AST 19 02/26/2021  ? ALT 15 02/26/2021  ? PROT 7.3 02/26/2021  ? ALBUMIN 4.5 02/27/2020  ? CALCIUM 9.4 02/26/2021  ? GFRAA 127 11/26/2020  ? QFTBGOLDPLUS NEGATIVE 06/25/2020  ? ? ?Speciality  Comments: No specialty comments available. ? ?Procedures:  ?No procedures performed ?Allergies: Sulfa antibiotics, Factive [gemifloxacin], and Penicillins  ? ?Assessment / Plan:     ?Visit Diagnoses: Rheumatoid arthritis of multiple sites without rheumatoid factor ([place]) ? ?High risk medication use ? ?Primary osteoarthritis of both hands ? ?Primary osteoarthritis of both hips ? ?Chronic midline low back pain with left-sided sciatica ? ?Left foot drop ? ?History of scoliosis ? ?Sicca syndrome ([place]) ? ?Diverticulitis ? ?Elevated LFTs ? ?Orders: ?No orders of the defined types were placed in this encounter. ? ?No orders of the defined types were placed in this encounter. ? ? ?Face-to-face time spent with patient was *** minutes. Greater than 50% of time was spent in counseling and coordination of care. ? ?Follow-Up Instructions: No follow-ups on file. ? ? ?Ofilia Neas, PA-C ? ?Note - This record has been created using [employer].  ?Chart creation errors have been sought, but may not always  ?have been located. Such creation errors do not reflect on  ?the standard of medical care. ? ?

## 2021-07-24 ENCOUNTER — Other Ambulatory Visit: Payer: Self-pay | Admitting: *Deleted

## 2021-07-24 ENCOUNTER — Telehealth: Payer: Self-pay | Admitting: Rheumatology

## 2021-07-24 DIAGNOSIS — Z79899 Other long term (current) drug therapy: Secondary | ICD-10-CM

## 2021-07-24 NOTE — Telephone Encounter (Signed)
Lab orders released and faxed.  

## 2021-07-24 NOTE — Telephone Encounter (Signed)
Patient called the office requesting lab orders be sent to [facility] in [place]. Patient plans on going Monday 3/6. ?

## 2021-07-30 ENCOUNTER — Ambulatory Visit: Payer: Managed Care, Other (non HMO) | Admitting: Physician Assistant

## 2021-07-30 DIAGNOSIS — Z8739 Personal history of other diseases of the musculoskeletal system and connective tissue: Secondary | ICD-10-CM

## 2021-07-30 DIAGNOSIS — M19041 Primary osteoarthritis, right hand: Secondary | ICD-10-CM

## 2021-07-30 DIAGNOSIS — M0609 Rheumatoid arthritis without rheumatoid factor, multiple sites: Secondary | ICD-10-CM

## 2021-07-30 DIAGNOSIS — M160 Bilateral primary osteoarthritis of hip: Secondary | ICD-10-CM

## 2021-07-30 DIAGNOSIS — M3500 Sicca syndrome, unspecified: Secondary | ICD-10-CM

## 2021-07-30 DIAGNOSIS — Z79899 Other long term (current) drug therapy: Secondary | ICD-10-CM

## 2021-07-30 DIAGNOSIS — R7989 Other specified abnormal findings of blood chemistry: Secondary | ICD-10-CM

## 2021-07-30 DIAGNOSIS — M21372 Foot drop, left foot: Secondary | ICD-10-CM

## 2021-07-30 DIAGNOSIS — K5792 Diverticulitis of intestine, part unspecified, without perforation or abscess without bleeding: Secondary | ICD-10-CM

## 2021-07-30 DIAGNOSIS — G8929 Other chronic pain: Secondary | ICD-10-CM

## 2021-07-30 NOTE — Progress Notes (Unsigned)
Office Visit Note  Patient: Michelle Pierce             Date of Birth: 1970/07/23           MRN: [id number]             PCP: Raina Mina., MD Referring: Raina Mina., MD Visit Date: 08/13/2021 Occupation: @GUAROCC @  Subjective:  No chief complaint on file.   History of Present Illness: Michelle Pierce is a 51 y.o. female ***   Activities of Daily Living:  Patient reports morning stiffness for *** {minute/hour:19697}.   Patient {ACTIONS;DENIES/REPORTS:21021675::"Denies"} nocturnal pain.  Difficulty dressing/grooming: {ACTIONS;DENIES/REPORTS:21021675::"Denies"} Difficulty climbing stairs: {ACTIONS;DENIES/REPORTS:21021675::"Denies"} Difficulty getting out of chair: {ACTIONS;DENIES/REPORTS:21021675::"Denies"} Difficulty using hands for taps, buttons, cutlery, and/or writing: {ACTIONS;DENIES/REPORTS:21021675::"Denies"}  No Rheumatology ROS completed.   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   Attention deficit hyperactivity disorder (ADHD), predominantly inattentive type 07/27/2018   Malaise and fatigue 07/27/2018   Overweight 07/27/2018   Dyspnea 02/06/2018   Stress at home 02/06/2018   Primary osteoarthritis of both hips 12/30/2016   History of scoliosis 12/30/2016   Elevated LFTs 12/30/2016   Sicca syndrome, unspecified 09/02/2016   Rheumatoid arthritis of multiple sites without rheumatoid factor ([place]) 04/08/2016   High risk medication use 04/08/2016   Osteoarthritis, 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    Past Medical History:  Diagnosis Date   Diverticulitis    per patient    Rheumatoid arthritis ([place])     Family History  Problem Relation Age of Onset   Hypertension Mother    Irritable bowel syndrome Mother    Stomach cancer Father    Lung cancer Father    Diverticulitis Father    Sleep apnea Sister     Ulcerative colitis Son    Cancer Paternal Grandmother    Diverticulitis Paternal Grandmother    Hodgkin's lymphoma Paternal Grandmother    Prostate cancer Other        Aunts grandfather on dad side    Bone cancer Other    Diverticulitis Paternal Aunt        had colon surgery and colostomy    Past Surgical History:  Procedure Laterality Date   COLONOSCOPY  10/19/2019   [facility] Mild diverticulosis was noted throughout the entire examined colon. The colon musoca was otherwise normal. Retroflexed views revealed internal Grade II second degree hemorrhoids.    KNEE ARTHROSCOPY Bilateral    Social History   Social History Narrative   Not on file   Immunization History  Administered Date(s) Administered   Influenza-Unspecified 03/07/2014, 02/24/2015, 02/22/2016   Moderna Sars-Covid-2 Vaccination 06/13/2020   PFIZER(Purple Top)SARS-COV-2 Vaccination 08/16/2019, 09/10/2019     Objective: Vital Signs: There were no vitals taken for this visit.   Physical Exam   Musculoskeletal Exam: ***  CDAI Exam: CDAI Score: -- Patient Global: --; Provider Global: -- Swollen: --; Tender: -- Joint Exam 08/13/2021   No joint exam has been documented for this visit   There is currently no information documented on the homunculus. Go to the Rheumatology activity and complete the homunculus joint exam.  Investigation: No additional findings.  Imaging: No results found.  Recent Labs: Lab Results  Component Value Date   WBC 6.9 02/26/2021   HGB 13.7 02/26/2021   PLT 279 02/26/2021   NA 139 02/26/2021   K 4.5 02/26/2021  CL 103 02/26/2021   CO2 27 02/26/2021   GLUCOSE 102 (H) 02/26/2021   BUN 9 02/26/2021   CREATININE 0.48 (L) 02/26/2021   BILITOT 0.5 02/26/2021   ALKPHOS 88 02/27/2020   AST 19 02/26/2021   ALT 15 02/26/2021   PROT 7.3 02/26/2021   ALBUMIN 4.5 02/27/2020   CALCIUM 9.4 02/26/2021   GFRAA 127 11/26/2020   QFTBGOLDPLUS NEGATIVE 06/25/2020    Speciality  Comments: No specialty comments available.  Procedures:  No procedures performed Allergies: Sulfa antibiotics, Factive [gemifloxacin], and Penicillins   Assessment / Plan:     Visit Diagnoses: Rheumatoid arthritis of multiple sites without rheumatoid factor (HCC)  High risk medication use  Primary osteoarthritis of both hands  Primary osteoarthritis of both hips  Left foot drop  History of scoliosis  Sicca syndrome (HCC)  Diverticulitis  Elevated LFTs  Orders: No orders of the defined types were placed in this encounter.  No orders of the defined types were placed in this encounter.   Face-to-face time spent with patient was *** minutes. Greater than 50% of time was spent in counseling and coordination of care.  Follow-Up Instructions: No follow-ups on file.   Ofilia Neas, PA-C  Note - This record has been created using Dragon software.  Chart creation errors have been sought, but may not always  have been located. Such creation errors do not reflect on  the standard of medical care.

## 2021-08-04 ENCOUNTER — Telehealth: Payer: Self-pay | Admitting: *Deleted

## 2021-08-04 NOTE — Telephone Encounter (Signed)
Labs received from:[facility] ? ?Drawn on:07/28/2021 ? ?Reviewed by:Sherron Ales, PA-C ? ?Labs drawn:CBC/CMP/ TB Gold ? ?Results:RDW 11.6 ? Glucose 152 ?TB Gold: Negative  ? ?Patient on Enbrel 50 mg SQ every 14 days.   ?

## 2021-08-13 ENCOUNTER — Ambulatory Visit: Payer: Managed Care, Other (non HMO) | Admitting: Physician Assistant

## 2021-08-13 DIAGNOSIS — Z8739 Personal history of other diseases of the musculoskeletal system and connective tissue: Secondary | ICD-10-CM

## 2021-08-13 DIAGNOSIS — K5792 Diverticulitis of intestine, part unspecified, without perforation or abscess without bleeding: Secondary | ICD-10-CM

## 2021-08-13 DIAGNOSIS — M160 Bilateral primary osteoarthritis of hip: Secondary | ICD-10-CM

## 2021-08-13 DIAGNOSIS — M21372 Foot drop, left foot: Secondary | ICD-10-CM

## 2021-08-13 DIAGNOSIS — M0609 Rheumatoid arthritis without rheumatoid factor, multiple sites: Secondary | ICD-10-CM

## 2021-08-13 DIAGNOSIS — M3500 Sicca syndrome, unspecified: Secondary | ICD-10-CM

## 2021-08-13 DIAGNOSIS — M19041 Primary osteoarthritis, right hand: Secondary | ICD-10-CM

## 2021-08-13 DIAGNOSIS — Z79899 Other long term (current) drug therapy: Secondary | ICD-10-CM

## 2021-08-13 DIAGNOSIS — R7989 Other specified abnormal findings of blood chemistry: Secondary | ICD-10-CM

## 2021-08-13 NOTE — Progress Notes (Signed)
? ?Office Visit Note ? ?Patient: Michelle Pierce             ?Date of Birth: [birth year]           ?MRN: [id number]             ?PCP: Gordan Payment., MD ?Referring: Gordan Payment., MD ?Visit Date: 08/19/2021 ?Occupation: @GUAROCC @ ? ?Subjective:  ?Medication monitoring  ? ?History of Present Illness: Michelle Pierce is a 51 y.o. female with history of seronegative rheumatoid arthritis and osteoarthritis. She is on Enbrel SureClick 50 mg sq injections every 10 days.  She continues to tolerate Enbrel without any side effects and has not missed any doses recently.  She has increased the frequency of Enbrel dosing from every 14 to about every 10 days.  She denies any recent rheumatoid arthritis flares.  She is not experiencing any morning stiffness, nocturnal pain, or difficulty with ADLs at this time.  She denies any joint swelling at this time.  She denies any recent infections.  She denies any new medical conditions since her last office visit. ? ? ? ?Activities of Daily Living:  ?Patient reports morning stiffness for 0  none .   ?Patient Denies nocturnal pain.  ?Difficulty dressing/grooming: Denies ?Difficulty climbing stairs: Denies ?Difficulty getting out of chair: Denies ?Difficulty using hands for taps, buttons, cutlery, and/or writing: Reports ? ?Review of Systems  ?Constitutional:  Positive for fatigue.  ?HENT:  Positive for mouth dryness. Negative for mouth sores and nose dryness.   ?Eyes:  Positive for dryness. Negative for pain and visual disturbance.  ?Respiratory:  Negative for cough, hemoptysis, shortness of breath and difficulty breathing.   ?Cardiovascular:  Negative for chest pain, palpitations, hypertension and swelling in legs/feet.  ?Gastrointestinal:  Negative for blood in stool, constipation and diarrhea.  ?Endocrine: Negative for increased urination.  ?Genitourinary:  Negative for difficulty urinating and painful urination.  ?Musculoskeletal:  Negative for joint pain, joint pain, joint  swelling, myalgias, muscle weakness, morning stiffness, muscle tenderness and myalgias.  ?Skin:  Positive for rash and nodules/bumps. Negative for color change, pallor, hair loss, skin tightness, ulcers and sensitivity to sunlight.  ?Allergic/Immunologic: Negative for susceptible to infections.  ?Neurological:  Negative for dizziness, numbness, headaches and weakness.  ?Hematological:  Positive for bruising/bleeding tendency. Negative for swollen glands.  ?Psychiatric/Behavioral:  Positive for sleep disturbance. Negative for depressed mood. The patient is not nervous/anxious.   ? ?PMFS History:  ?Patient Active Problem List  ? Diagnosis Date Noted  ? Diverticulitis 09/05/2019  ? Juvenile rheumatoid arthritis (HCC) 12/27/2018  ? Anxiety disorder 07/27/2018  ? 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 Malaise and fatigue 07/27/2018  ? Overweight 07/27/2018  ? Dyspnea 02/06/2018  ? Stress at home 02/06/2018  ? Primary osteoarthritis of both hips 12/30/2016  ? History of scoliosis 12/30/2016  ? Elevated LFTs 12/30/2016  ? Sicca syndrome, unspecified 09/02/2016  ? Rheumatoid arthritis of multiple sites without rheumatoid factor (HCC) 04/08/2016  ? High risk medication use 04/08/2016  ? 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  ?Past Medical History:  ?Diagnosis Date  ? Diverticulitis   ? per patient   ? Rheumatoid arthritis (HCC)   ?  ?Family History  ?Problem Relation Age of Onset  ? Hypertension Mother   ? Irritable bowel syndrome Mother   ? Stomach cancer Father   ? Lung cancer Father   ? Diverticulitis Father   ? Sleep apnea Sister   ?  Ulcerative colitis Son   ? Cancer Paternal Grandmother   ? Diverticulitis Paternal Grandmother   ? Hodgkin's lymphoma Paternal Grandmother   ? Prostate cancer Other   ?     Aunts grandfather on dad side   ? Bone cancer Other   ? Diverticulitis Paternal Aunt   ?     had colon surgery and colostomy   ? ?Past  Surgical History:  ?Procedure Laterality Date  ? COLONOSCOPY  10/19/2019  ? [facility]. Mild diverticulosis was noted throughout the entire examined colon. The colon musoca was otherwise normal. Retroflexed views revealed internal Grade II second degree hemorrhoids.   ? KNEE ARTHROSCOPY Bilateral   ? ?Social History  ? ?Social History Narrative  ? Not on file  ? ?Immunization History  ?Administered Date(s) Administered  ? Influenza-Unspecified 03/07/2014, 02/24/2015, 02/22/2016  ? Moderna Sars-Covid-2 Vaccination 06/13/2020  ? PFIZER(Purple Top)SARS-COV-2 Vaccination 08/16/2019, 09/10/2019  ?  ? ?Objective: ?Vital Signs: BP 134/78 (BP Location: Left Arm, Patient Position: Sitting, Cuff Size: Normal)   Pulse 84   Resp 15   Ht 5\' 6"  (1.676 m)   Wt 169 lb (76.7 kg)   BMI 27.28 kg/m?   ? ?Physical Exam ?Vitals and nursing note reviewed.  ?Constitutional:   ?   Appearance: She is well-developed.  ?HENT:  ?   Head: Normocephalic and atraumatic.  ?Eyes:  ?   Conjunctiva/sclera: Conjunctivae normal.  ?Cardiovascular:  ?   Rate and Rhythm: Normal rate and regular rhythm.  ?   Heart sounds: Normal heart sounds.  ?Pulmonary:  ?   Effort: Pulmonary effort is normal.  ?   Breath sounds: Normal breath sounds.  ?Abdominal:  ?   General: Bowel sounds are normal.  ?   Palpations: Abdomen is soft.  ?Musculoskeletal:  ?   Cervical back: Normal range of motion.  ?Skin: ?   General: Skin is warm and dry.  ?   Capillary Refill: Capillary refill takes less than 2 seconds.  ?Neurological:  ?   Mental Status: She is alert and oriented to person, place, and time.  ?Psychiatric:     ?   Behavior: Behavior normal.  ?  ? ?Musculoskeletal Exam: C-spine, thoracic spine, lumbar spine have good range of motion.  Shoulder joints, elbow joints, wrist joints, MCPs, PIPs, DIPs have good range of motion with no synovitis.  Complete fist formation bilaterally.  Hip joints have good range of motion with no groin pain.  Knee joints have good range of  motion with no warmth or effusion.  Ankle joints have good range of motion with no tenderness or joint swelling. ? ?CDAI Exam: ?CDAI Score: 0.4  ?Patient Global: 2 mm; Provider Global: 2 mm ?Swollen: 0 ; Tender: 0  ?Joint Exam 08/19/2021  ? ?No joint exam has been documented for this visit  ? ?There is currently no information documented on the homunculus. Go to the Rheumatology activity and complete the homunculus joint exam. ? ?Investigation: ?No additional findings. ? ?Imaging: ?No results found. ? ?Recent Labs: ?Lab Results  ?Component Value Date  ? WBC 6.9 02/26/2021  ? HGB 13.7 02/26/2021  ? PLT 279 02/26/2021  ? NA 139 02/26/2021  ? K 4.5 02/26/2021  ? CL 103 02/26/2021  ? CO2 27 02/26/2021  ? GLUCOSE 102 (H) 02/26/2021  ? BUN 9 02/26/2021  ? CREATININE 0.48 (L) 02/26/2021  ? BILITOT 0.5 02/26/2021  ? ALKPHOS 88 02/27/2020  ? AST 19 02/26/2021  ? ALT 15 02/26/2021  ? PROT  7.3 02/26/2021  ? ALBUMIN 4.5 02/27/2020  ? CALCIUM 9.4 02/26/2021  ? GFRAA 127 11/26/2020  ? QFTBGOLDPLUS NEGATIVE 06/25/2020  ? ? ?Speciality Comments: No specialty comments available. ? ?Procedures:  ?No procedures performed ?Allergies: Amoxicillin, Sulfa antibiotics, Gemifloxacin, and Penicillins  ? ?Assessment / Plan:     ?Visit Diagnoses: Rheumatoid arthritis of multiple sites without rheumatoid factor (HCC) - She has no joint tenderness or synovitis on examination today.  She has not had any signs or symptoms of a rheumatoid arthritis flare.  She she has clinically been doing well on Enbrel 50 mg subcu injections every 10 days.  She increased the frequency of Enbrel dosing from every 14 days to about every 10 days which has improved her intermittent arthralgias and joint stiffness.  Her rheumatoid arthritis is well controlled on the current treatment regimen and no medication changes will be made at this time.  X-rays of both hands and feet were updated today to assess for radiographic progression.  She was advised to notify us if she  develops increased joint pain or joint swelling.  She will follow-up in the office in 5 months.  Plan: XR Hand 2 View Right, XR Hand 2 View Left, XR Foot 2 Views Left, XR Foot 2 Views Right ? ?High risk me

## 2021-08-19 ENCOUNTER — Ambulatory Visit (INDEPENDENT_AMBULATORY_CARE_PROVIDER_SITE_OTHER): Payer: Managed Care, Other (non HMO)

## 2021-08-19 ENCOUNTER — Encounter: Payer: Self-pay | Admitting: Physician Assistant

## 2021-08-19 ENCOUNTER — Ambulatory Visit: Payer: Managed Care, Other (non HMO) | Admitting: Physician Assistant

## 2021-08-19 VITALS — BP 134/78 | HR 84 | Resp 15 | Ht 66.0 in | Wt 169.0 lb

## 2021-08-19 DIAGNOSIS — M19041 Primary osteoarthritis, right hand: Secondary | ICD-10-CM | POA: Diagnosis not present

## 2021-08-19 DIAGNOSIS — M0609 Rheumatoid arthritis without rheumatoid factor, multiple sites: Secondary | ICD-10-CM

## 2021-08-19 DIAGNOSIS — M21372 Foot drop, left foot: Secondary | ICD-10-CM

## 2021-08-19 DIAGNOSIS — M79671 Pain in right foot: Secondary | ICD-10-CM

## 2021-08-19 DIAGNOSIS — M5442 Lumbago with sciatica, left side: Secondary | ICD-10-CM

## 2021-08-19 DIAGNOSIS — M19042 Primary osteoarthritis, left hand: Secondary | ICD-10-CM

## 2021-08-19 DIAGNOSIS — G8929 Other chronic pain: Secondary | ICD-10-CM

## 2021-08-19 DIAGNOSIS — Z79899 Other long term (current) drug therapy: Secondary | ICD-10-CM

## 2021-08-19 DIAGNOSIS — Z8739 Personal history of other diseases of the musculoskeletal system and connective tissue: Secondary | ICD-10-CM

## 2021-08-19 DIAGNOSIS — M79642 Pain in left hand: Secondary | ICD-10-CM

## 2021-08-19 DIAGNOSIS — M160 Bilateral primary osteoarthritis of hip: Secondary | ICD-10-CM

## 2021-08-19 DIAGNOSIS — K5792 Diverticulitis of intestine, part unspecified, without perforation or abscess without bleeding: Secondary | ICD-10-CM

## 2021-08-19 DIAGNOSIS — M79641 Pain in right hand: Secondary | ICD-10-CM

## 2021-08-19 DIAGNOSIS — M3500 Sicca syndrome, unspecified: Secondary | ICD-10-CM

## 2021-08-19 MED ORDER — ENBREL SURECLICK 50 MG/ML ~~LOC~~ SOAJ: SUBCUTANEOUS | 0 refills | Status: DC

## 2021-08-19 NOTE — Addendum Note (Signed)
Addended by: Henriette Combs on: 08/19/2021 03:26 PM ? ? Modules accepted: Orders ? ?

## 2021-08-19 NOTE — Patient Instructions (Signed)
Standing Labs ?We placed an order today for your standing lab work.  ? ?Please have your standing labs drawn in June and every 3 months  ? ?If possible, please have your labs drawn 2 weeks prior to your appointment so that the provider can discuss your results at your appointment. ? ?Please note that you may see your imaging and lab results in MyChart before we have reviewed them. ?We may be awaiting multiple results to interpret others before contacting you. ?Please allow our office up to 72 hours to thoroughly review all of the results before contacting the office for clarification of your results. ? ?We have open lab daily: ?Monday through Thursday from 1:30-4:30 PM and Friday from 1:30-4:00 PM ?at the office of Dr. Shaili Deveshwar, [facility].   ?Please be advised, all patients with office appointments requiring lab work will take precedent over walk-in lab work.  ?If possible, please come for your lab work on Monday and Friday afternoons, as you may experience shorter wait times. ?The office is located at [address], [address], [place], [place] [address] ?No appointment is necessary.   ?Labs are drawn by [facility]. Please bring your co-pay at the time of your lab draw.  You may receive a bill from [facility] for your lab work. ? ?Please note if you are on Hydroxychloroquine and and an order has been placed for a Hydroxychloroquine level, you will need to have it drawn 4 hours or more after your last dose. ? ?If you wish to have your labs drawn at another location, please call the office 24 hours in advance to send orders. ? ?If you have any questions regarding directions or hours of operation,  ?please call [phone].   ?As a reminder, please drink plenty of water prior to coming for your lab work. Thanks! ? ?

## 2021-08-19 NOTE — Addendum Note (Signed)
Addended by: Gearldine Bienenstock on: 08/19/2021 03:25 PM ? ? Modules accepted: Orders ? ?

## 2021-08-20 NOTE — Progress Notes (Signed)
X-rays of both hands and both feet are consistent with rheumatoid arthritis and osteoarthritis overlap.  No radiographic progression noted when compared to x-rays from 2020.

## 2021-10-26 ENCOUNTER — Other Ambulatory Visit: Payer: Self-pay | Admitting: Physician Assistant

## 2021-10-26 NOTE — Telephone Encounter (Signed)
Next Visit: 01/20/2022  Last Visit: 08/19/2021  Last Fill: 08/19/2021  [id number] arthritis of multiple sites without rheumatoid factor   Current Dose per office note [id number]: Enbrel SureClick 50 mg sq injections every 10 days  Labs: 07/28/2021, RDW 11.6  Glucose 152   TB Gold: 07/28/2021 TB Gold: Negative    Okay to refill Enbrel?

## 2021-11-26 ENCOUNTER — Encounter: Payer: Self-pay | Admitting: Rheumatology

## 2021-11-26 ENCOUNTER — Telehealth: Payer: Self-pay | Admitting: Rheumatology

## 2021-11-26 NOTE — Telephone Encounter (Signed)
Spoke with patient and she states her insurance changed as of July first. Patient advised we will need to get a new PA through her new insurance prior to sending in the prescription. Patient will send a copy of her new insurance cards via my chart. Will forward once received to pharmacy team to complete the PA.

## 2021-11-26 NOTE — Telephone Encounter (Signed)
Patient called the office stating that because of an insurance change she is no longer using [facility] for Enbrel. Patient states she now uses [facility] and they need a new prescription.

## 2021-11-27 ENCOUNTER — Telehealth: Payer: Self-pay | Admitting: Pharmacist

## 2021-11-27 NOTE — Telephone Encounter (Signed)
Submitted a Prior Authorization request to [facility] for ENBREL via CoverMyMeds. Will update once we receive a response.  Key: [id number]  Chesley Mires, PharmD, MPH, BCPS, CPP Clinical Pharmacist (Rheumatology and Pulmonology)

## 2021-11-30 NOTE — Telephone Encounter (Signed)
Received notification from [facility] regarding a prior authorization for ENBREL. Authorization has been APPROVED from 11/27/2021 through 11/26/2022  Patient can continue to fill through [facility]: [phone]  Chesley Mires, PharmD, MPH, BCPS, CPP Clinical Pharmacist (Rheumatology and Pulmonology)

## 2021-12-09 ENCOUNTER — Other Ambulatory Visit: Payer: Self-pay | Admitting: *Deleted

## 2021-12-09 ENCOUNTER — Telehealth: Payer: Self-pay | Admitting: Rheumatology

## 2021-12-09 DIAGNOSIS — Z79899 Other long term (current) drug therapy: Secondary | ICD-10-CM

## 2021-12-09 MED ORDER — ENBREL SURECLICK 50 MG/ML ~~LOC~~ SOAJ: SUBCUTANEOUS | 0 refills | Status: DC

## 2021-12-09 NOTE — Telephone Encounter (Signed)
Lab Orders faxed

## 2021-12-09 NOTE — Telephone Encounter (Signed)
Patient called requesting her labwork orders be sent to [facility].  Patient plans to go tomorrow morning, 12/10/21.

## 2021-12-09 NOTE — Telephone Encounter (Signed)
Refill request received via fax from [facility] to refill Enbrel.  Next Visit: 01/20/2022  Last Visit: 08/19/2021  [id number] arthritis of multiple sites without rheumatoid factor   Current Dose per office note 08/19/2021: Enbrel SureClick 50 mg sq injections every 10 days  Labs: 07/28/2021, RDW 11.6  Glucose 152  TB Gold: 07/28/2021 TB Gold: Negative     Left message to advise patient she is due to update labs. Standing Orders in place.   Okay to refill Enbrel?

## 2021-12-16 ENCOUNTER — Telehealth: Payer: Self-pay | Admitting: *Deleted

## 2021-12-16 NOTE — Telephone Encounter (Signed)
Labs received from:[facility]  Drawn on:12/10/2021  Reviewed by:Sherron Ales, PA-C  Labs drawn:CBC/CMP  Results:WNL

## 2021-12-18 IMAGING — CT CT ABD-PELV W/ CM
2 of 4 series · 16 of 46 positions shown, 18 images · IV contrast (OMNIPAQUE 300)
Comparison: 08/17/2019 from [facility]

CLINICAL DATA: Left lower quadrant pain and tenderness. Personal
history of diverticulitis.

EXAM:
CT ABDOMEN AND PELVIS WITH CONTRAST
TECHNIQUE: Multidetector CT imaging of the abdomen and pelvis was performed
using the standard protocol following bolus administration of
intravenous contrast.
CONTRAST:  100mL OMNIPAQUE IOHEXOL 300 MG/ML  SOLN

[Series 2: abd/pel w · axial · 0.71mm/px · z∈[-454,-79]mm · 13 of 83 slices shown, 15 images]
[im 4/83  soft-tissue]
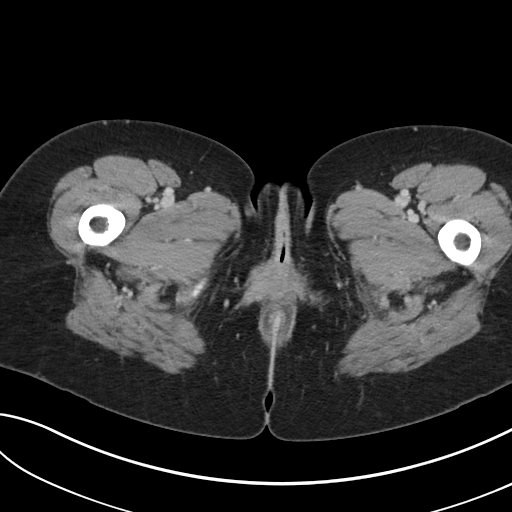
[im 4/83  bone]
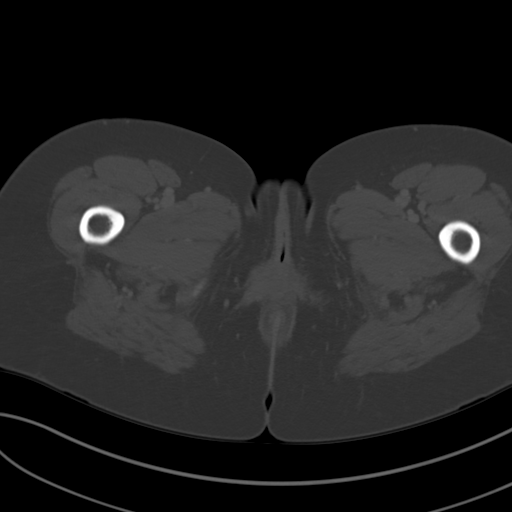
[im 11/83  soft-tissue]
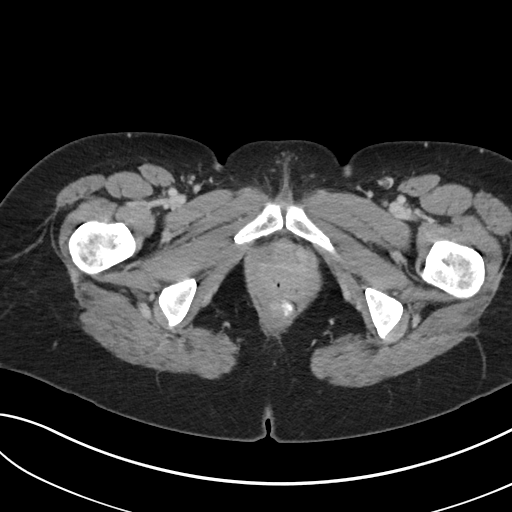
[im 18/83  soft-tissue]
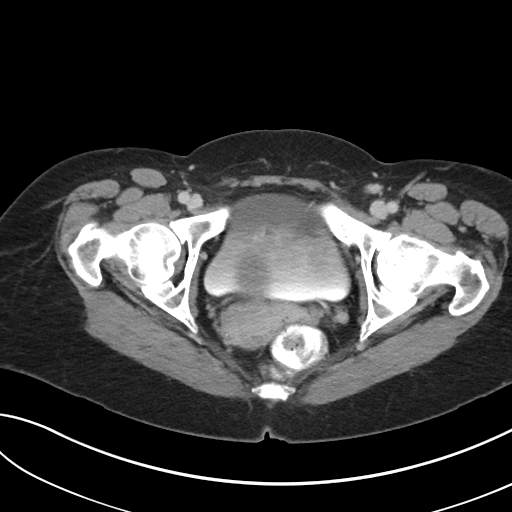
[im 24/83  soft-tissue]
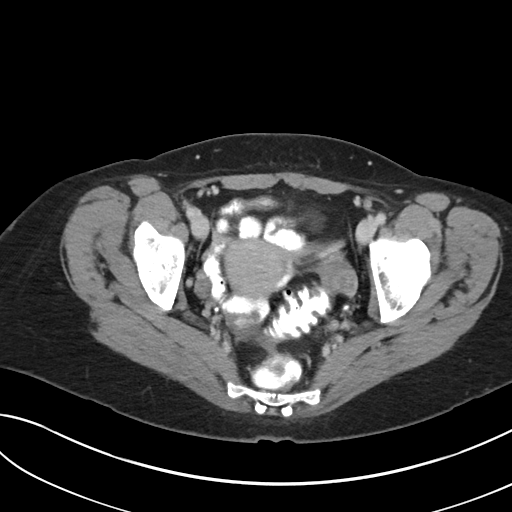
[im 28/83  soft-tissue]
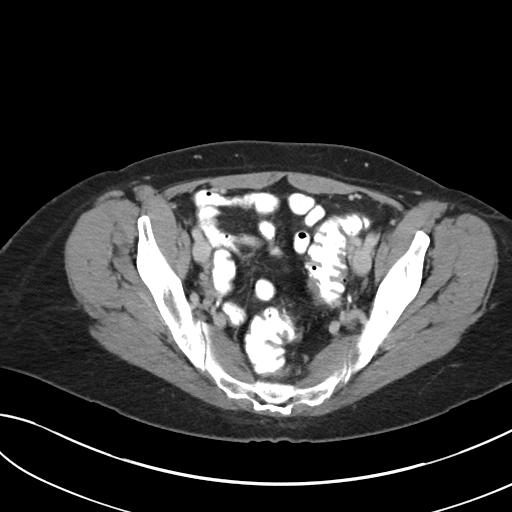
[im 35/83  soft-tissue]
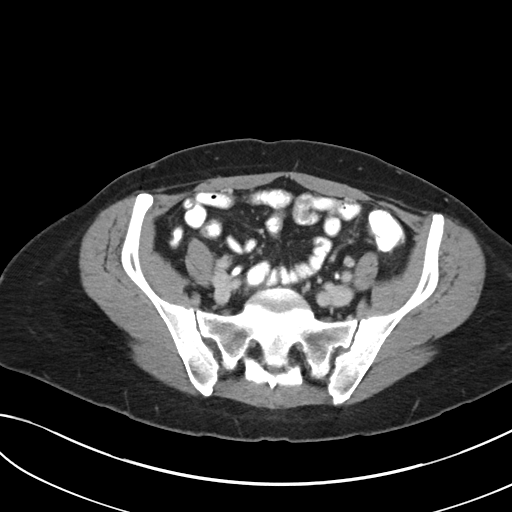
[im 42/83  soft-tissue]
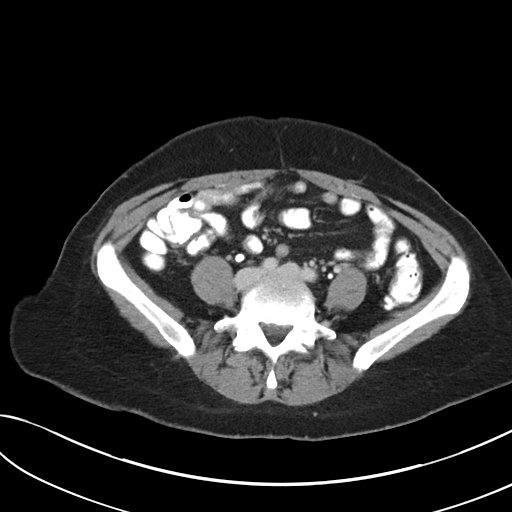
[im 48/83  soft-tissue]
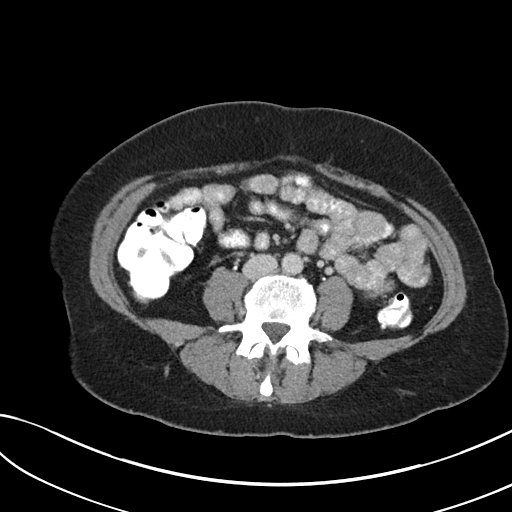
[im 55/83  soft-tissue]
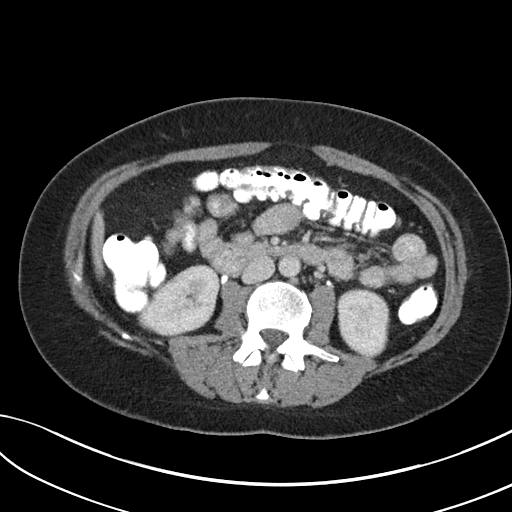
[im 55/83  bone]
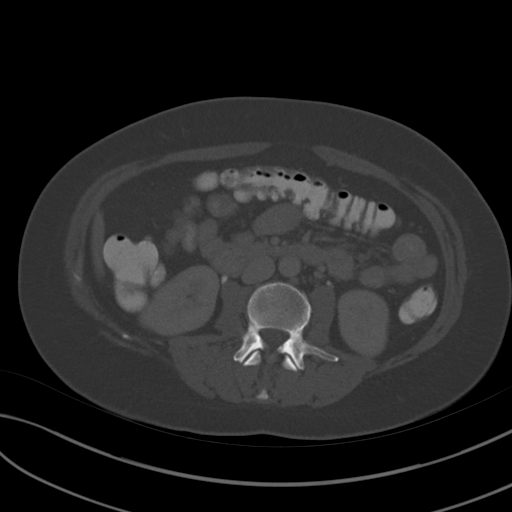
[im 59/83  soft-tissue]
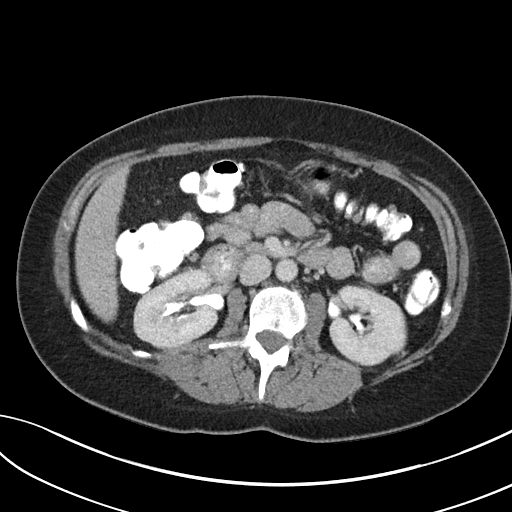
[im 65/83  soft-tissue]
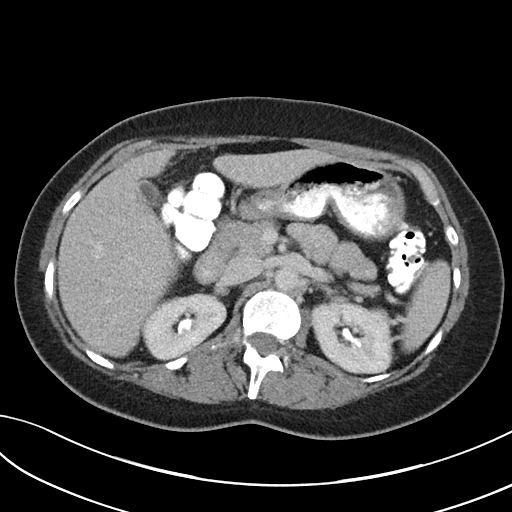
[im 72/83  soft-tissue]
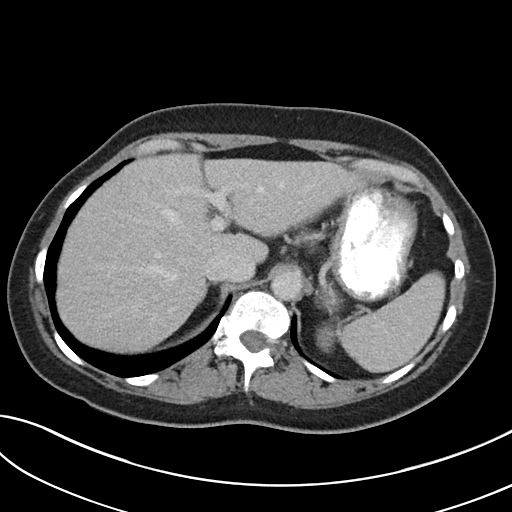
[im 79/83  soft-tissue]
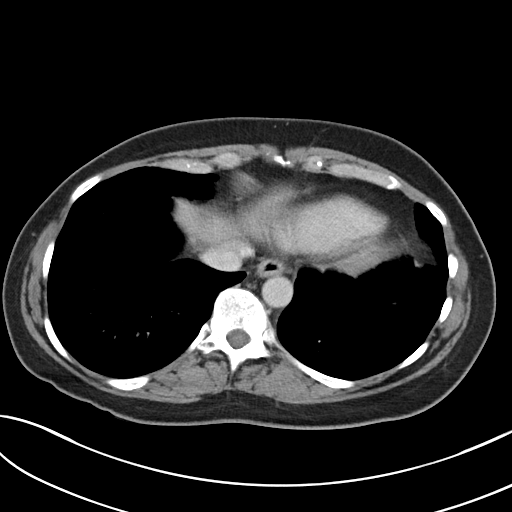

[Series 5: abd/pel w st · coronal · 0.67mm/px · 3 of 70 slices shown]
[im 24/70  soft-tissue]
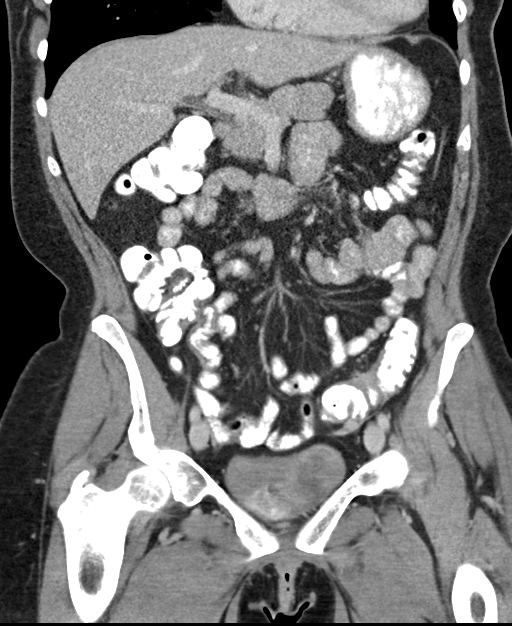
[im 31/70  soft-tissue]
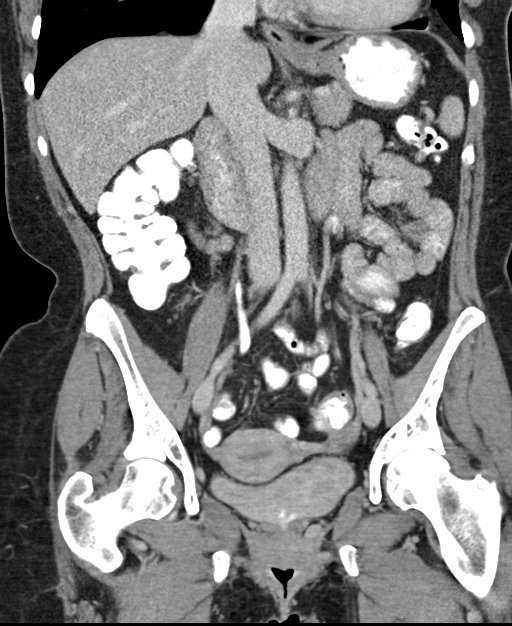
[im 39/70  soft-tissue]
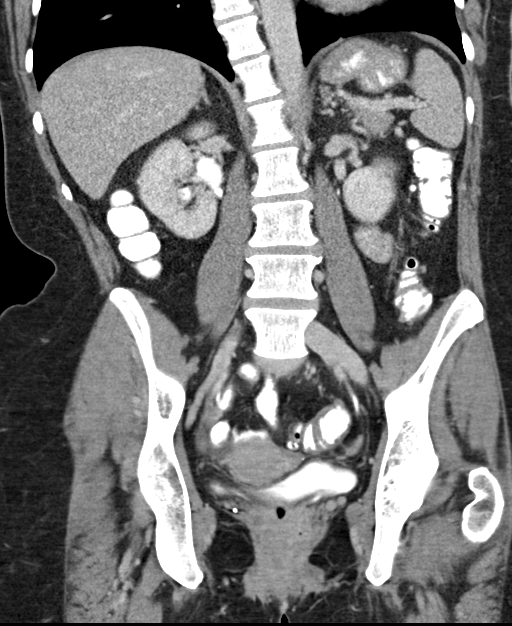

[16 of 46 positions shown; findings below may reference images not displayed]

FINDINGS: Lower Chest: No acute findings.

Hepatobiliary: No hepatic masses identified. Gallbladder is
unremarkable. No evidence of biliary ductal dilatation.

Pancreas:  No mass or inflammatory changes.

Spleen: Within normal limits in size and appearance.

Adrenals/Urinary Tract: No masses identified. Small cyst again seen
in upper pole of left kidney. No evidence of ureteral calculi or
hydronephrosis.

Stomach/Bowel: No evidence of obstruction, inflammatory process or
abnormal fluid collections. Normal appendix visualized.
Diverticulosis is seen mainly involving the descending and sigmoid
colon, however there is no evidence of diverticulitis.

Vascular/Lymphatic: No pathologically enlarged lymph nodes. No
abdominal aortic aneurysm.

Reproductive:  No mass or other significant abnormality.

Other:  None.

Musculoskeletal:  No suspicious bone lesions identified.
IMPRESSION: Colonic diverticulosis. No radiographic evidence of diverticulitis
or other acute findings.

## 2022-01-06 NOTE — Progress Notes (Deleted)
Office Visit Note  Patient: Michelle Pierce             Date of Birth: Feb 13, 1971           MRN: [id number]             PCP: Gordan Payment., MD Referring: Gordan Payment., MD Visit Date: 01/20/2022 Occupation: @GUAROCC @  Subjective:  No chief complaint on file.   History of Present Illness: Michelle Pierce is a 51 y.o. female ***   Activities of Daily Living:  Patient reports morning stiffness for *** {minute/hour:19697}.   Patient {ACTIONS;DENIES/REPORTS:21021675::"Denies"} nocturnal pain.  Difficulty dressing/grooming: {ACTIONS;DENIES/REPORTS:21021675::"Denies"} Difficulty climbing stairs: {ACTIONS;DENIES/REPORTS:21021675::"Denies"} Difficulty getting out of chair: {ACTIONS;DENIES/REPORTS:21021675::"Denies"} Difficulty using hands for taps, buttons, cutlery, and/or writing: {ACTIONS;DENIES/REPORTS:21021675::"Denies"}  No Rheumatology ROS completed.   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   Malaise and fatigue 07/27/2018   Overweight 07/27/2018   Dyspnea 02/06/2018   Stress at home 02/06/2018   Primary osteoarthritis of both hips 12/30/2016   History of scoliosis 12/30/2016   Elevated LFTs 12/30/2016   Sicca syndrome, unspecified 09/02/2016   Rheumatoid arthritis of multiple sites without rheumatoid factor (HCC) 04/08/2016   High risk medication use 04/08/2016   Osteoarthritis, 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    Past Medical History:  Diagnosis Date   Diverticulitis    per patient    Rheumatoid arthritis (HCC)     Family History  Problem Relation Age of Onset   Hypertension Mother    Irritable bowel syndrome Mother    Stomach cancer Father    Lung cancer Father    Diverticulitis Father    Sleep apnea Sister     Ulcerative colitis Son    Cancer Paternal Grandmother    Diverticulitis Paternal Grandmother    Hodgkin's lymphoma Paternal Grandmother    Prostate cancer Other        Aunts grandfather on dad side    Bone cancer Other    Diverticulitis Paternal Aunt        had colon surgery and colostomy    Past Surgical History:  Procedure Laterality Date   COLONOSCOPY  10/19/2019   [facility]. Mild diverticulosis was noted throughout the entire examined colon. The colon musoca was otherwise normal. Retroflexed views revealed internal Grade II second degree hemorrhoids.    KNEE ARTHROSCOPY Bilateral    Social History   Social History Narrative   Not on file   Immunization History  Administered Date(s) Administered   Influenza-Unspecified 03/07/2014, 02/24/2015, 02/22/2016   Moderna Sars-Covid-2 Vaccination 06/13/2020   PFIZER(Purple Top)SARS-COV-2 Vaccination 08/16/2019, 09/10/2019     Objective: Vital Signs: There were no vitals taken for this visit.   Physical Exam   Musculoskeletal Exam: ***  CDAI Exam: CDAI Score: -- Patient Global: --; Provider Global: -- Swollen: --; Tender: -- Joint Exam 01/20/2022   No joint exam has been documented for this visit   There is currently no information documented on the homunculus. Go to the Rheumatology activity and complete the homunculus joint exam.  Investigation: No additional findings.  Imaging: No results found.  Recent Labs: Lab Results  Component Value Date   WBC 6.9 02/26/2021   HGB 13.7 02/26/2021   PLT 279 02/26/2021   NA 139 02/26/2021   K 4.5 02/26/2021  CL 103 02/26/2021   CO2 27 02/26/2021   GLUCOSE 102 (H) 02/26/2021   BUN 9 02/26/2021   CREATININE 0.48 (L) 02/26/2021   BILITOT 0.5 02/26/2021   ALKPHOS 88 02/27/2020   AST 19 02/26/2021   ALT 15 02/26/2021   PROT 7.3 02/26/2021   ALBUMIN 4.5 02/27/2020   CALCIUM 9.4 02/26/2021   GFRAA 127 11/26/2020   QFTBGOLDPLUS NEGATIVE 06/25/2020    Speciality  Comments: No specialty comments available.  Procedures:  No procedures performed Allergies: Amoxicillin, Sulfa antibiotics, Gemifloxacin, and Penicillins   Assessment / Plan:     Visit Diagnoses: No diagnosis found.  Orders: No orders of the defined types were placed in this encounter.  No orders of the defined types were placed in this encounter.   Face-to-face time spent with patient was *** minutes. Greater than 50% of time was spent in counseling and coordination of care.  Follow-Up Instructions: No follow-ups on file.   Ellen Henri, CMA  Note - This record has been created using Animal nutritionist.  Chart creation errors have been sought, but may not always  have been located. Such creation errors do not reflect on  the standard of medical care.

## 2022-01-12 ENCOUNTER — Other Ambulatory Visit: Payer: Self-pay | Admitting: Physician Assistant

## 2022-01-12 NOTE — Telephone Encounter (Signed)
Next Visit: 01/20/2022   Last Visit: 08/19/2021   [id number] arthritis of multiple sites without rheumatoid factor    Current Dose per office note 08/19/2021: Enbrel SureClick 50 mg sq injections every 10 days   Labs: 12/10/2021 WNL   TB Gold: 07/28/2021 TB Gold: Negative     Last Fill: 12/09/2021 (30 day supply)   Okay to refill Enbrel?

## 2022-01-18 NOTE — Progress Notes (Unsigned)
Office Visit Note  Patient: Michelle Pierce             Date of Birth: June 28, 1970           MRN: [id number]             PCP: Gordan Payment., MD Referring: Gordan Payment., MD Visit Date: 02/01/2022 Occupation: @GUAROCC @  Subjective:  Left thumb pain   History of Present Illness: Michelle Pierce is a 51 y.o. female with history of seronegative rheumatoid arthritis and osteoarthritis. She is on Enbrel SureClick 50 mg sq injections every 10 days.  Patient reports that if she is overdue for her injection and pushes it to every 14 days she experiences increased arthralgias and joint stiffness.  She states that she has having some persistent discomfort in the left thumb and both shoulders.  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  She continues to see her chiropractor on a regular basis as well as has a massage on a maintenance basis.  She denies any joint swelling at this time.  She has not been taking any NSAIDs or over-the-counter products for pain relief recently. She denies any recent or recurrent infections.  She denies any new medical conditions.    Activities of Daily Living:  Patient reports morning stiffness for 0 minutes.   Patient Denies nocturnal pain.  Difficulty dressing/grooming: Denies Difficulty climbing stairs: Denies Difficulty getting out of chair: Denies Difficulty using hands for taps, buttons, cutlery, and/or writing: Denies  Review of Systems  Constitutional:  Positive for fatigue.  HENT:  Positive for mouth dryness. Negative for mouth sores.   Eyes:  Positive for dryness.  Respiratory:  Negative for shortness of breath.   Cardiovascular:  Negative for chest pain and palpitations.  Gastrointestinal:  Negative for blood in stool, constipation and diarrhea.  Endocrine: Negative for increased urination.  Genitourinary:  Negative for involuntary urination.  Musculoskeletal:  Positive for  joint pain and joint pain. Negative for gait problem, joint swelling, myalgias, muscle weakness, morning stiffness, muscle tenderness and myalgias.  Skin:  Negative for color change, rash, hair loss and sensitivity to sunlight.  Allergic/Immunologic: Positive for susceptible to infections.  Neurological:  Negative for dizziness and headaches.  Hematological:  Negative for swollen glands.  Psychiatric/Behavioral:  Negative for depressed mood and sleep disturbance. The patient is not nervous/anxious.     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   Malaise and fatigue 07/27/2018   Overweight 07/27/2018   Dyspnea 02/06/2018   Stress at home 02/06/2018   Primary osteoarthritis of both hips 12/30/2016   History of scoliosis 12/30/2016   Elevated LFTs 12/30/2016   Sicca syndrome, unspecified 09/02/2016   Rheumatoid arthritis of multiple sites without rheumatoid factor (HCC) 04/08/2016   High risk medication use 04/08/2016   Osteoarthritis, 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    Past Medical History:  Diagnosis Date   Diverticulitis    per patient    Rheumatoid arthritis (HCC)     Family History  Problem Relation Age of Onset   Hypertension Mother    Irritable bowel syndrome Mother    Stomach cancer Father    Lung cancer Father    Diverticulitis Father    Sleep  apnea Sister    Ulcerative colitis Son    Cancer Paternal Grandmother    Diverticulitis Paternal Grandmother    Hodgkin's lymphoma Paternal Grandmother    Prostate cancer Other        Aunts grandfather on dad side    Bone cancer Other    Diverticulitis Paternal Aunt        had colon surgery and colostomy    Past Surgical History:  Procedure Laterality Date   COLONOSCOPY   10/19/2019   [facility]. Mild diverticulosis was noted throughout the entire examined colon. The colon musoca was otherwise normal. Retroflexed views revealed internal Grade II second degree hemorrhoids.    KNEE ARTHROSCOPY Bilateral    Social History   Social History Narrative   Not on file   Immunization History  Administered Date(s) Administered   Influenza-Unspecified 03/07/2014, 02/24/2015, 02/22/2016   Moderna Sars-Covid-2 Vaccination 06/13/2020   PFIZER(Purple Top)SARS-COV-2 Vaccination 08/16/2019, 09/10/2019     Objective: Vital Signs: BP 132/85 (BP Location: Left Arm, Patient Position: Sitting, Cuff Size: Normal)   Pulse 80   Resp 15   Ht 5\' 6"  (1.676 m)   Wt 170 lb (77.1 kg)   BMI 27.44 kg/m    Physical Exam Vitals and nursing note reviewed.  Constitutional:      Appearance: She is well-developed.  HENT:     Head: Normocephalic and atraumatic.  Eyes:     Conjunctiva/sclera: Conjunctivae normal.  Cardiovascular:     Rate and Rhythm: Normal rate and regular rhythm.     Heart sounds: Normal heart sounds.  Pulmonary:     Effort: Pulmonary effort is normal.     Breath sounds: Normal breath sounds.  Abdominal:     General: Bowel sounds are normal.     Palpations: Abdomen is soft.  Musculoskeletal:     Cervical back: Normal range of motion.  Skin:    General: Skin is warm and dry.     Capillary Refill: Capillary refill takes less than 2 seconds.  Neurological:     Mental Status: She is alert and oriented to person, place, and time.  Psychiatric:        Behavior: Behavior normal.      Musculoskeletal Exam: C-spine, thoracic spine, and lumbar spine good ROM. Tenderness over subacromial bursa bilaterally.  Good ROM of both shoulder joints with some discomfort with external rotation of the right shoulder.  Elbow joints, wrist joints, Mcps, PIPs, and DIPs good ROM with no synovitis.  Tenderness over the left 1st MCP joint.  Complete fist formation bilaterally. Hip  joints good ROM with no groin pain.  Knee joints have good ROM with no warmth or effusion.  Ankle joints have good ROM with no tenderness or joint swelling.   CDAI Exam: CDAI Score: 1.8  Patient Global: 4 mm; Provider Global: 4 mm Swollen: 0 ; Tender: 1  Joint Exam 02/01/2022      Right  Left  MCP 1      Tender     Investigation: No additional findings.  Imaging: No results found.  Recent Labs: Lab Results  Component Value Date   WBC 6.9 02/26/2021   HGB 13.7 02/26/2021   PLT 279 02/26/2021   NA 139 02/26/2021   K 4.5 02/26/2021   CL 103 02/26/2021   CO2 27 02/26/2021   GLUCOSE 102 (H) 02/26/2021   BUN 9 02/26/2021   CREATININE 0.48 (L) 02/26/2021   BILITOT 0.5 02/26/2021   ALKPHOS 88 02/27/2020   AST  19 02/26/2021   ALT 15 02/26/2021   PROT 7.3 02/26/2021   ALBUMIN 4.5 02/27/2020   CALCIUM 9.4 02/26/2021   GFRAA 127 11/26/2020   QFTBGOLDPLUS NEGATIVE 06/25/2020    Speciality Comments: No specialty comments available.  Procedures:  No procedures performed Allergies: Amoxicillin, Sulfa antibiotics, Gemifloxacin, and Penicillins    Assessment / Plan:     Visit Diagnoses: Rheumatoid arthritis of multiple sites without rheumatoid factor (HCC): She has no synovitis on examination.  She has some tenderness over bilateral subacromial bursa both shoulders as well as the left first MCP joint on examination today.  She has been experiencing increased joint stiffness and arthralgias leading up to her Enbrel injections.  She tried spacing Enbrel to every 14 days but has had breakthrough symptoms even at day 10.  She was advised to resume Enbrel 50 mg subcutaneous injections once weekly.  She was advised to notify us if she continues to have persistent discomfort.  A prescription for Voltaren gel will be sent to the pharmacy which he applied to her joints topically as needed for pain relief.  She will follow-up in the office in 5 months or sooner if needed.  High risk  medication use - Enbrel SureClick 50 mg sq injections every week.  Methotrexate discontinued due to elevated LFTs in June 2019.  - Plan: Ambulatory referral to Dermatology Enbrel 50 mg sq injections once weekly.  Increased arthralgias while spacing the dose to every 10 and 14 days.  TB gold negative on 07/28/21.   CBC and CMP updated on 12/16/21.  She will be due to update lab work in October and every 3 months to monitor for drug toxicity. She has not had any renal or recurrent infections.  Discussed the importance of holding Enbrel if she develops signs or symptoms of an infection and to resume once the infection is completely cleared.   Discussed the importance of yearly skin cancer screening while on Enbrel.  A referral to [facility] dermatology will be placed today.  Primary osteoarthritis of both hands: She has PIP and DIP prominence consistent with osteoarthritic changes.  No synovitis noted.  Tenderness of the left 1st MCP joint.  A prescription for voltaren gel will be sent to the pharmacy.  Discussed the importance of joint protection and muscle strengthening.   Primary osteoarthritis of both hips: She has good ROM of both hip joints with no groin pain currently.   History of scoliosis: She has adjustments by her chiropractor and sees her massage therapist on a regular basis.   Sicca syndrome (HCC): Chronic, unchanged.   Diverticulitis  Orders: Orders Placed This Encounter  Procedures   Ambulatory referral to Dermatology   No orders of the defined types were placed in this encounter.    Follow-Up Instructions: Return in about 5 months (around 07/04/2022) for Rheumatoid arthritis, Osteoarthritis.   Gearldine Bienenstock, PA-C  Note - This record has been created using Dragon software.  Chart creation errors have been sought, but may not always  have been located. Such creation errors do not reflect on  the standard of medical care.

## 2022-01-20 ENCOUNTER — Ambulatory Visit: Payer: Managed Care, Other (non HMO) | Admitting: Rheumatology

## 2022-01-20 DIAGNOSIS — Z8739 Personal history of other diseases of the musculoskeletal system and connective tissue: Secondary | ICD-10-CM

## 2022-01-20 DIAGNOSIS — M19041 Primary osteoarthritis, right hand: Secondary | ICD-10-CM

## 2022-01-20 DIAGNOSIS — M3500 Sicca syndrome, unspecified: Secondary | ICD-10-CM

## 2022-01-20 DIAGNOSIS — Z79899 Other long term (current) drug therapy: Secondary | ICD-10-CM

## 2022-01-20 DIAGNOSIS — M0609 Rheumatoid arthritis without rheumatoid factor, multiple sites: Secondary | ICD-10-CM

## 2022-01-20 DIAGNOSIS — G8929 Other chronic pain: Secondary | ICD-10-CM

## 2022-01-20 DIAGNOSIS — K5792 Diverticulitis of intestine, part unspecified, without perforation or abscess without bleeding: Secondary | ICD-10-CM

## 2022-01-20 DIAGNOSIS — M160 Bilateral primary osteoarthritis of hip: Secondary | ICD-10-CM

## 2022-02-01 ENCOUNTER — Other Ambulatory Visit: Payer: Self-pay

## 2022-02-01 ENCOUNTER — Encounter: Payer: Self-pay | Admitting: Physician Assistant

## 2022-02-01 ENCOUNTER — Other Ambulatory Visit: Payer: Self-pay | Admitting: Rheumatology

## 2022-02-01 ENCOUNTER — Ambulatory Visit: Payer: BC Managed Care – PPO | Attending: Rheumatology | Admitting: Physician Assistant

## 2022-02-01 VITALS — BP 132/85 | HR 80 | Resp 15 | Ht 66.0 in | Wt 170.0 lb

## 2022-02-01 DIAGNOSIS — M3500 Sicca syndrome, unspecified: Secondary | ICD-10-CM

## 2022-02-01 DIAGNOSIS — Z79899 Other long term (current) drug therapy: Secondary | ICD-10-CM

## 2022-02-01 DIAGNOSIS — G8929 Other chronic pain: Secondary | ICD-10-CM

## 2022-02-01 DIAGNOSIS — M19041 Primary osteoarthritis, right hand: Secondary | ICD-10-CM | POA: Diagnosis not present

## 2022-02-01 DIAGNOSIS — M0609 Rheumatoid arthritis without rheumatoid factor, multiple sites: Secondary | ICD-10-CM

## 2022-02-01 DIAGNOSIS — M160 Bilateral primary osteoarthritis of hip: Secondary | ICD-10-CM

## 2022-02-01 DIAGNOSIS — K5792 Diverticulitis of intestine, part unspecified, without perforation or abscess without bleeding: Secondary | ICD-10-CM

## 2022-02-01 DIAGNOSIS — Z8739 Personal history of other diseases of the musculoskeletal system and connective tissue: Secondary | ICD-10-CM

## 2022-02-01 DIAGNOSIS — M19042 Primary osteoarthritis, left hand: Secondary | ICD-10-CM

## 2022-02-01 MED ORDER — ENBREL SURECLICK 50 MG/ML ~~LOC~~ SOAJ: 50.0000 mg | SUBCUTANEOUS | 0 refills | Status: DC

## 2022-02-01 MED ORDER — DICLOFENAC SODIUM 1 % EX GEL: CUTANEOUS | 2 refills | Status: AC

## 2022-02-01 NOTE — Telephone Encounter (Signed)
Ok to send in voltaren gel

## 2022-02-01 NOTE — Patient Instructions (Addendum)
Standing Labs We placed an order today for your standing lab work.   Please have your standing labs drawn in October and every 3 months   If possible, please have your labs drawn 2 weeks prior to your appointment so that the provider can discuss your results at your appointment.  Please note that you may see your imaging and lab results in MyChart before we have reviewed them. We may be awaiting multiple results to interpret others before contacting you. Please allow our office up to 72 hours to thoroughly review all of the results before contacting the office for clarification of your results.  We currently have open lab daily: Monday through Thursday from 1:30 PM-4:30 PM and Friday from 1:30 PM- 4:00 PM If possible, please come for your lab work on Monday, Thursday or Friday afternoons, as you may experience shorter wait times.   Effective March 24, 2022 the new lab hours will change to: Monday through Thursday from 1:30 PM-5:00 PM and Friday from 8:30 AM-12:00 PM If possible, please come for your lab work on Monday and Thursday afternoons, as you may experience shorter wait times.  Please be advised, all patients with office appointments requiring lab work will take precedent over walk-in lab work.    The office is located at [address], [place], [place] [address] No appointment is necessary.   Labs are drawn by [facility]. Please bring your co-pay at the time of your lab draw.  You may receive a bill from [facility] for your lab work.  Please note if you are on Hydroxychloroquine and and an order has been placed for a Hydroxychloroquine level, you will need to have it drawn 4 hours or more after your last dose.  If you wish to have your labs drawn at another location, please call the office 24 hours in advance to send orders.  If you have any questions regarding directions or hours of operation,  please call [phone].   As a reminder, please drink plenty of water prior  to coming for your lab work. Thanks!  If you have signs or symptoms of an infection or start antibiotics: First, call your PCP for workup of your infection. Hold your medication through the infection, until you complete your antibiotics, and until symptoms resolve if you take the following: Injectable medication (Actemra, Benlysta, Cimzia, Cosentyx, Enbrel, Humira, Kevzara, Orencia, Remicade, Simponi, Stelara, Taltz, Tremfya) Methotrexate Leflunomide (Arava) Mycophenolate (Cellcept) Harriette Ohara, Olumiant, or Rinvoq Vaccines You are taking a medication(s) that can suppress your immune system.  The following immunizations are recommended: Flu annually Covid-19  Td/Tdap (tetanus, diphtheria, pertussis) every 10 years Pneumonia (Prevnar 15 then Pneumovax 23 at least 1 year apart.  Alternatively, can take Prevnar 20 without needing additional dose) Shingrix: 2 doses from 4 weeks to 6 months apart  Please check with your PCP to make sure you are up to date.   Shoulder Exercises Ask your health care provider which exercises are safe for you. Do exercises exactly as told by your health care provider and adjust them as directed. It is normal to feel mild stretching, pulling, tightness, or discomfort as you do these exercises. Stop right away if you feel sudden pain or your pain gets worse. Do not begin these exercises until told by your health care provider. Stretching exercises External rotation and abduction This exercise is sometimes called corner stretch. The exercise rotates your arm outward (external rotation) and moves your arm out from your body (abduction). Stand in a  doorway with one of your feet slightly in front of the other. This is called a staggered stance. If you cannot reach your forearms to the door frame, stand facing a corner of a room. Choose one of the following positions as told by your health care provider: Place your hands and forearms on the door frame above your head. Place  your hands and forearms on the door frame at the height of your head. Place your hands on the door frame at the height of your elbows. Slowly move your weight onto your front foot until you feel a stretch across your chest and in the front of your shoulders. Keep your head and chest upright and keep your abdominal muscles tight. Hold for __________ seconds. To release the stretch, shift your weight to your back foot. Repeat __________ times. Complete this exercise __________ times a day. Extension, standing  Stand and hold a broomstick, a cane, or a similar object behind your back. Your hands should be a little wider than shoulder-width apart. Your palms should face away from your back. Keeping your elbows straight and your shoulder muscles relaxed, move the stick away from your body until you feel a stretch in your shoulders (extension). Avoid shrugging your shoulders while you move the stick. Keep your shoulder blades tucked down toward the middle of your back. Hold for __________ seconds. Slowly return to the starting position. Repeat __________ times. Complete this exercise __________ times a day. Range-of-motion exercises Pendulum  Stand near a wall or a surface that you can hold onto for balance. Bend at the waist and let your left / right arm hang straight down. Use your other arm to support you. Keep your back straight and do not lock your knees. Relax your left / right arm and shoulder muscles, and move your hips and your trunk so your left / right arm swings freely. Your arm should swing because of the motion of your body, not because you are using your arm or shoulder muscles. Keep moving your hips and trunk so your arm swings in the following directions, as told by your health care provider: Side to side. Forward and backward. In clockwise and counterclockwise circles. Continue each motion for __________ seconds, or for as long as told by your health care provider. Slowly  return to the starting position. Repeat __________ times. Complete this exercise __________ times a day. Shoulder flexion, standing  Stand and hold a broomstick, a cane, or a similar object. Place your hands a little more than shoulder-width apart on the object. Your left / right hand should be palm-up, and your other hand should be palm-down. Keep your elbow straight and your shoulder muscles relaxed. Push the stick up with your healthy arm to raise your left / right arm in front of your body, and then over your head until you feel a stretch in your shoulder (flexion). Avoid shrugging your shoulder while you raise your arm. Keep your shoulder blade tucked down toward the middle of your back. Hold for __________ seconds. Slowly return to the starting position. Repeat __________ times. Complete this exercise __________ times a day. Shoulder abduction, standing  Stand and hold a broomstick, a cane, or a similar object. Place your hands a little more than shoulder-width apart on the object. Your left / right hand should be palm-up, and your other hand should be palm-down. Keep your elbow straight and your shoulder muscles relaxed. Push the object across your body toward your left / right side. Raise  your left / right arm to the side of your body (abduction) until you feel a stretch in your shoulder. Do not raise your arm above shoulder height unless your health care provider tells you to do that. If directed, raise your arm over your head. Avoid shrugging your shoulder while you raise your arm. Keep your shoulder blade tucked down toward the middle of your back. Hold for __________ seconds. Slowly return to the starting position. Repeat __________ times. Complete this exercise __________ times a day. Internal rotation  Place your left / right hand behind your back, palm-up. Use your other hand to dangle an exercise band, a broomstick, or a similar object over your shoulder. Grasp the band with  your left / right hand so you are holding on to both ends. Gently pull up on the band until you feel a stretch in the front of your left / right shoulder. The movement of your arm toward the center of your body is called internal rotation. Avoid shrugging your shoulder while you raise your arm. Keep your shoulder blade tucked down toward the middle of your back. Hold for __________ seconds. Release the stretch by letting go of the band and lowering your hands. Repeat __________ times. Complete this exercise __________ times a day. Strengthening exercises External rotation  Sit in a stable chair without armrests. Secure an exercise band to a stable object at elbow height on your left / right side. Place a soft object, such as a folded towel or a small pillow, between your left / right upper arm and your body to move your elbow about 4 inches (10 cm) away from your side. Hold the end of the exercise band so it is tight and there is no slack. Keeping your elbow pressed against the soft object, slowly move your forearm out, away from your abdomen (external rotation). Keep your body steady so only your forearm moves. Hold for __________ seconds. Slowly return to the starting position. Repeat __________ times. Complete this exercise __________ times a day. Shoulder abduction  Sit in a stable chair without armrests, or stand up. Hold a __________ lb / kg weight in your left / right hand, or hold an exercise band with both hands. Start with your arms straight down and your left / right palm facing in, toward your body. Slowly lift your left / right hand out to your side (abduction). Do not lift your hand above shoulder height unless your health care provider tells you that this is safe. Keep your arms straight. Avoid shrugging your shoulder while you do this movement. Keep your shoulder blade tucked down toward the middle of your back. Hold for __________ seconds. Slowly lower your arm, and return  to the starting position. Repeat __________ times. Complete this exercise __________ times a day. Shoulder extension  Sit in a stable chair without armrests, or stand up. Secure an exercise band to a stable object in front of you so it is at shoulder height. Hold one end of the exercise band in each hand. Straighten your elbows and lift your hands up to shoulder height. Squeeze your shoulder blades together as you pull your hands down to the sides of your thighs (extension). Stop when your hands are straight down by your sides. Do not let your hands go behind your body. Hold for __________ seconds. Slowly return to the starting position. Repeat __________ times. Complete this exercise __________ times a day. Shoulder row  Sit in a stable chair without armrests, or  stand up. Secure an exercise band to a stable object in front of you so it is at chest height. Hold one end of the exercise band in each hand. Position your palms so that your thumbs are facing the ceiling (neutral position). Bend each of your elbows to a 90-degree angle (right angle) and keep your upper arms at your sides. Step back or move the chair back until the band is tight and there is no slack. Slowly pull your elbows back behind you. Hold for __________ seconds. Slowly return to the starting position. Repeat __________ times. Complete this exercise __________ times a day. Shoulder press-ups  Sit in a stable chair that has armrests. Sit upright, with your feet flat on the floor. Put your hands on the armrests so your elbows are bent and your fingers are pointing forward. Your hands should be about even with the sides of your body. Push down on the armrests and use your arms to lift yourself off the chair. Straighten your elbows and lift yourself up as much as you comfortably can. Move your shoulder blades down, and avoid letting your shoulders move up toward your ears. Keep your feet on the ground. As you get stronger,  your feet should support less of your body weight as you lift yourself up. Hold for __________ seconds. Slowly lower yourself back into the chair. Repeat __________ times. Complete this exercise __________ times a day. Wall push-ups  Stand so you are facing a stable wall. Your feet should be about one arm-length away from the wall. Lean forward and place your palms on the wall at shoulder height. Keep your feet flat on the floor as you bend your elbows and lean forward toward the wall. Hold for __________ seconds. Straighten your elbows to push yourself back to the starting position. Repeat __________ times. Complete this exercise __________ times a day. This information is not intended to replace advice given to you by your health care provider. Make sure you discuss any questions you have with your health care provider. Document Revised: 06/30/2021 Document Reviewed: 06/30/2021 Elsevier Patient Education  2023 [employer].

## 2022-02-01 NOTE — Progress Notes (Unsigned)
Please review pended no print enbrel prescription that reflects dose change that was advised today at patient's appointment. Thanks!

## 2022-02-01 NOTE — Telephone Encounter (Signed)
Patient states at her appointment Michelle Pierce discussed a cream that she could apply to her arthritis when she is in pain and patient declined.  Patient states she changed her mind and requested it be sent to [facility] in [place].

## 2022-02-15 DIAGNOSIS — N959 Unspecified menopausal and perimenopausal disorder: Secondary | ICD-10-CM | POA: Diagnosis not present

## 2022-02-15 DIAGNOSIS — K5732 Diverticulitis of large intestine without perforation or abscess without bleeding: Secondary | ICD-10-CM | POA: Diagnosis not present

## 2022-02-15 DIAGNOSIS — M069 Rheumatoid arthritis, unspecified: Secondary | ICD-10-CM | POA: Diagnosis not present

## 2022-02-15 DIAGNOSIS — H04123 Dry eye syndrome of bilateral lacrimal glands: Secondary | ICD-10-CM | POA: Diagnosis not present

## 2022-03-03 DIAGNOSIS — N959 Unspecified menopausal and perimenopausal disorder: Secondary | ICD-10-CM | POA: Diagnosis not present

## 2022-03-03 DIAGNOSIS — M069 Rheumatoid arthritis, unspecified: Secondary | ICD-10-CM | POA: Diagnosis not present

## 2022-04-12 ENCOUNTER — Other Ambulatory Visit: Payer: Self-pay

## 2022-04-12 ENCOUNTER — Other Ambulatory Visit: Payer: Self-pay | Admitting: Physician Assistant

## 2022-04-12 DIAGNOSIS — Z79899 Other long term (current) drug therapy: Secondary | ICD-10-CM

## 2022-04-12 NOTE — Telephone Encounter (Signed)
Next Visit: 07/06/2022  Last Visit: 02/01/2022  Last Fill: 02/01/2022  DX: Rheumatoid arthritis of multiple sites without rheumatoid factor   Current Dose per office note on 02/01/2022: Enbrel SureClick 50 mg sq injections every week.   Labs: 12/10/2021   Advised patient that she is due to update labs, patient verbalized understanding and requested that lab orders are faxed to [facility]. She will get the labs drawn today or tomorrow. Lab orders have been faxed.   Okay to refill enbrel?

## 2022-05-04 DIAGNOSIS — Z01419 Encounter for gynecological examination (general) (routine) without abnormal findings: Secondary | ICD-10-CM | POA: Diagnosis not present

## 2022-05-04 DIAGNOSIS — Z1231 Encounter for screening mammogram for malignant neoplasm of breast: Secondary | ICD-10-CM | POA: Diagnosis not present

## 2022-05-06 ENCOUNTER — Other Ambulatory Visit: Payer: Self-pay | Admitting: Obstetrics and Gynecology

## 2022-05-06 DIAGNOSIS — R928 Other abnormal and inconclusive findings on diagnostic imaging of breast: Secondary | ICD-10-CM

## 2022-05-12 DIAGNOSIS — Z23 Encounter for immunization: Secondary | ICD-10-CM | POA: Diagnosis not present

## 2022-05-12 DIAGNOSIS — F900 Attention-deficit hyperactivity disorder, predominantly inattentive type: Secondary | ICD-10-CM | POA: Diagnosis not present

## 2022-05-12 DIAGNOSIS — M0600 Rheumatoid arthritis without rheumatoid factor, unspecified site: Secondary | ICD-10-CM | POA: Diagnosis not present

## 2022-05-12 DIAGNOSIS — Z0000 Encounter for general adult medical examination without abnormal findings: Secondary | ICD-10-CM | POA: Diagnosis not present

## 2022-05-12 DIAGNOSIS — M3500 Sicca syndrome, unspecified: Secondary | ICD-10-CM | POA: Diagnosis not present

## 2022-05-21 ENCOUNTER — Ambulatory Visit
Admission: RE | Admit: 2022-05-21 | Discharge: 2022-05-21 | Disposition: A | Discharge status: Home / Self Care | Payer: BC Managed Care – PPO | Source: Ambulatory Visit | Attending: Obstetrics and Gynecology | Admitting: Obstetrics and Gynecology

## 2022-05-21 ENCOUNTER — Ambulatory Visit: Payer: BC Managed Care – PPO

## 2022-05-21 DIAGNOSIS — R922 Inconclusive mammogram: Secondary | ICD-10-CM | POA: Diagnosis not present

## 2022-05-21 DIAGNOSIS — R928 Other abnormal and inconclusive findings on diagnostic imaging of breast: Secondary | ICD-10-CM

## 2022-05-24 ENCOUNTER — Other Ambulatory Visit: Payer: Self-pay | Admitting: Rheumatology

## 2022-06-22 NOTE — Progress Notes (Signed)
Office Visit Note  Patient: Michelle Pierce             Date of Birth: [birth year]           MRN: [id number]             PCP: Raina Mina., MD Referring: Raina Mina., MD Visit Date: 07/06/2022 Occupation: @GUAROCC$ @  Subjective:  Medication management  History of Present Illness: Michelle Pierce is a 52 y.o. female with history of seropositive rheumatoid arthritis.  She returns today after her last visit in September 2023.  She states she has been taking Enbrel 50 mg subcu every 10-12 days without any interruption.  She denies having a flare of rheumatoid arthritis.  She continues to have dry mouth and dry eyes.  She has been using over-the-counter products which has been helpful.  She has not had swelling in her wrist joints and ankles in a while.  She continues to wait her bills and also is on the phone for an IT office.  She states that she does not have much time for exercise.  She has not had a flare of diverticulitis in a long time.    Activities of Daily Living:  Patient reports morning stiffness for 0 minute.   Patient Denies nocturnal pain.  Difficulty dressing/grooming: Denies Difficulty climbing stairs: Denies Difficulty getting out of chair: Denies Difficulty using hands for taps, buttons, cutlery, and/or writing: Denies  Review of Systems  Constitutional:  Negative for fatigue.  HENT:  Positive for mouth dryness. Negative for mouth sores.   Eyes:  Positive for dryness.  Respiratory:  Negative for shortness of breath.   Cardiovascular:  Negative for chest pain and palpitations.  Gastrointestinal:  Negative for blood in stool, constipation and diarrhea.  Endocrine: Negative for increased urination.  Genitourinary:  Negative for involuntary urination.  Musculoskeletal:  Negative for joint pain, gait problem, joint pain, joint swelling, myalgias, muscle weakness, morning stiffness, muscle tenderness and myalgias.  Skin:  Negative for color change, rash, hair  loss and sensitivity to sunlight.  Allergic/Immunologic: Negative for susceptible to infections.  Neurological:  Negative for dizziness and headaches.  Hematological:  Negative for swollen glands.  Psychiatric/Behavioral:  Negative for depressed mood and sleep disturbance. The patient is not nervous/anxious.     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   Malaise and fatigue 07/27/2018   Overweight 07/27/2018   Dyspnea 02/06/2018   Stress at home 02/06/2018   Primary osteoarthritis of both hips 12/30/2016   History of scoliosis 12/30/2016   Elevated LFTs 12/30/2016   Sicca syndrome, unspecified 09/02/2016   Rheumatoid arthritis of multiple sites without rheumatoid factor ([place]) 04/08/2016   High risk medication use 04/08/2016   Osteoarthritis, 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    Past Medical History:  Diagnosis Date   Diverticulitis    per patient    Rheumatoid arthritis ([place])     Family History  Problem Relation Age of Onset   Hypertension Mother    Irritable bowel syndrome Mother    Stomach cancer Father    Lung cancer Father    Diverticulitis Father    Sleep apnea Sister    Ulcerative colitis Son    Cancer Paternal Grandmother    Diverticulitis Paternal Grandmother  Hodgkin's lymphoma Paternal Grandmother    Prostate cancer Other        Aunts grandfather on dad side    Bone cancer Other    Diverticulitis Paternal Aunt        had colon surgery and colostomy    Past Surgical History:  Procedure Laterality Date   COLONOSCOPY  10/19/2019   [facility]. Mild diverticulosis was noted throughout the entire examined colon. The colon musoca was otherwise normal. Retroflexed views revealed internal Grade II second degree  hemorrhoids.    KNEE ARTHROSCOPY Bilateral    Social History   Social History Narrative   Not on file   Immunization History  Administered Date(s) Administered   Influenza-Unspecified 03/07/2014, 02/24/2015, 02/22/2016   Moderna Sars-Covid-2 Vaccination 06/13/2020   PFIZER(Purple Top)SARS-COV-2 Vaccination 08/16/2019, 09/10/2019     Objective: Vital Signs: BP 121/82 (BP Location: Left Arm, Patient Position: Sitting, Cuff Size: Large)   Pulse 67   Resp 12   Ht 5' 6"$  (1.676 m)   Wt 170 lb (77.1 kg)   LMP 07/04/2022   BMI 27.44 kg/m    Physical Exam Vitals and nursing note reviewed.  Constitutional:      Appearance: She is well-developed.  HENT:     Head: Normocephalic and atraumatic.  Eyes:     Conjunctiva/sclera: Conjunctivae normal.  Cardiovascular:     Rate and Rhythm: Normal rate and regular rhythm.     Heart sounds: Normal heart sounds.  Pulmonary:     Effort: Pulmonary effort is normal.     Breath sounds: Normal breath sounds.  Abdominal:     General: Bowel sounds are normal.     Palpations: Abdomen is soft.  Musculoskeletal:     Cervical back: Normal range of motion.  Lymphadenopathy:     Cervical: No cervical adenopathy.  Skin:    General: Skin is warm and dry.     Capillary Refill: Capillary refill takes less than 2 seconds.  Neurological:     Mental Status: She is alert and oriented to person, place, and time.  Psychiatric:        Behavior: Behavior normal.      Musculoskeletal Exam: Cervical, thoracic and lumbar spine were in good range of motion.  She had good range of motion of both shoulders, elbows, wrist joints, MCPs PIPs and DIPs.  No synovitis was noted.  Hip joints and knee joints in good range of motion.  There was no tenderness over ankles or MTPs.  CDAI Exam: CDAI Score: -- Patient Global: 2 mm; Provider Global: 1 mm Swollen: --; Tender: -- Joint Exam 07/06/2022   No joint exam has been documented for this visit   There is  currently no information documented on the homunculus. Go to the Rheumatology activity and complete the homunculus joint exam.  Investigation: No additional findings.  Imaging: No results found.  Recent Labs: Lab Results  Component Value Date   WBC 6.9 02/26/2021   HGB 13.7 02/26/2021   PLT 279 02/26/2021   NA 139 02/26/2021   K 4.5 02/26/2021   CL 103 02/26/2021   CO2 27 02/26/2021   GLUCOSE 102 (H) 02/26/2021   BUN 9 02/26/2021   CREATININE 0.48 (L) 02/26/2021   BILITOT 0.5 02/26/2021   ALKPHOS 88 02/27/2020   AST 19 02/26/2021   ALT 15 02/26/2021   PROT 7.3 02/26/2021   ALBUMIN 4.5 02/27/2020   CALCIUM 9.4 02/26/2021   GFRAA 127 11/26/2020   QFTBGOLDPLUS NEGATIVE 06/25/2020  Speciality Comments: No specialty comments available.  Procedures:  No procedures performed Allergies: Amoxicillin, Sulfa antibiotics, Gemifloxacin, and Penicillins   Assessment / Plan:     Visit Diagnoses: Rheumatoid arthritis of multiple sites without rheumatoid factor (HCC)-patient denies having a flare of rheumatoid arthritis since her last visit.  She had no synovitis on examination.  She has been taking Enbrel every 10 to 12 days.  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  She uses Voltaren gel as needed.  High risk medication use - Enbrel SureClick 50 mg sq injections every 10 to 12 days.  Methotrexate discontinued due to elevated LFTs in June 2019. -Labs obtained on February 26, 2021 CBC and CMP were normal.  TB Gold was negative on June 25, 2020.  Will get labs today along with TB Gold.  She was advised to get CBC and CMP every 3 months.  Formation on immunization was placed in the AVS.  She was advised to stop Enbrel if she develops an infection and resume after the infection resolves.  Annual skin examination to screen for skin cancer was advised.  Use of sunscreen was advised.  Plan: CBC with Differential/Platelet, COMPLETE METABOLIC PANEL WITH GFR, QuantiFERON-TB Gold Plus  Primary  osteoarthritis of both hands-she had bilateral PIP and DIP thickening.  No synovitis was noted.  Primary osteoarthritis of both hips-she has good range of motion of bilateral hip joints without discomfort.  History of scoliosis - She has adjustments by her chiropractor and sees her massage therapist on a regular basis.  Sicca syndrome (HCC)-she uses over-the-counter products.  Diverticulitis-she denies any Raynauds.  BMI 27.44-increased risk of heart disease with rheumatoid arthritis was discussed.  Need for regular exercise was emphasized.  A handout on dietary modifications and exercise was placed in the AVS.  Orders: Orders Placed This Encounter  Procedures   CBC with Differential/Platelet   COMPLETE METABOLIC PANEL WITH GFR   QuantiFERON-TB Gold Plus   No orders of the defined types were placed in this encounter.    Follow-Up Instructions: Return in about 5 months (around 12/04/2022) for Rheumatoid arthritis.   Bo Merino, MD  Note - This record has been created using Editor, commissioning.  Chart creation errors have been sought, but may not always  have been located. Such creation errors do not reflect on  the standard of medical care.

## 2022-06-30 DIAGNOSIS — Z1329 Encounter for screening for other suspected endocrine disorder: Secondary | ICD-10-CM | POA: Diagnosis not present

## 2022-06-30 DIAGNOSIS — N959 Unspecified menopausal and perimenopausal disorder: Secondary | ICD-10-CM | POA: Diagnosis not present

## 2022-06-30 DIAGNOSIS — M069 Rheumatoid arthritis, unspecified: Secondary | ICD-10-CM | POA: Diagnosis not present

## 2022-06-30 DIAGNOSIS — K5732 Diverticulitis of large intestine without perforation or abscess without bleeding: Secondary | ICD-10-CM | POA: Diagnosis not present

## 2022-06-30 DIAGNOSIS — H04123 Dry eye syndrome of bilateral lacrimal glands: Secondary | ICD-10-CM | POA: Diagnosis not present

## 2022-06-30 DIAGNOSIS — E569 Vitamin deficiency, unspecified: Secondary | ICD-10-CM | POA: Diagnosis not present

## 2022-07-06 ENCOUNTER — Encounter: Payer: Self-pay | Admitting: Rheumatology

## 2022-07-06 ENCOUNTER — Ambulatory Visit: Payer: BC Managed Care – PPO | Attending: Rheumatology | Admitting: Rheumatology

## 2022-07-06 VITALS — BP 121/82 | HR 67 | Resp 12 | Ht 66.0 in | Wt 170.0 lb

## 2022-07-06 DIAGNOSIS — Z79899 Other long term (current) drug therapy: Secondary | ICD-10-CM

## 2022-07-06 DIAGNOSIS — M160 Bilateral primary osteoarthritis of hip: Secondary | ICD-10-CM

## 2022-07-06 DIAGNOSIS — K5792 Diverticulitis of intestine, part unspecified, without perforation or abscess without bleeding: Secondary | ICD-10-CM

## 2022-07-06 DIAGNOSIS — M19041 Primary osteoarthritis, right hand: Secondary | ICD-10-CM | POA: Diagnosis not present

## 2022-07-06 DIAGNOSIS — Z8739 Personal history of other diseases of the musculoskeletal system and connective tissue: Secondary | ICD-10-CM

## 2022-07-06 DIAGNOSIS — M0609 Rheumatoid arthritis without rheumatoid factor, multiple sites: Secondary | ICD-10-CM

## 2022-07-06 DIAGNOSIS — M19042 Primary osteoarthritis, left hand: Secondary | ICD-10-CM

## 2022-07-06 DIAGNOSIS — Z6827 Body mass index (BMI) 27.0-27.9, adult: Secondary | ICD-10-CM

## 2022-07-06 DIAGNOSIS — M3500 Sicca syndrome, unspecified: Secondary | ICD-10-CM

## 2022-07-06 NOTE — Patient Instructions (Addendum)
Standing Labs We placed an order today for your standing lab work.   Please have your standing labs drawn in May and every 3 months  Please have your labs drawn 2 weeks prior to your appointment so that the provider can discuss your lab results at your appointment.  Please note that you may see your imaging and lab results in [facility] before we have reviewed them. We will contact you once all results are reviewed. Please allow our office up to 72 hours to thoroughly review all of the results before contacting the office for clarification of your results.  Lab hours are:   Monday through Thursday from 8:00 am -12:30 pm and 1:00 pm-5:00 pm and Friday from 8:00 am-12:00 pm.  Please be advised, all patients with office appointments requiring lab work will take precedent over walk-in lab work.   Labs are drawn by [facility]. Please bring your co-pay at the time of your lab draw.  You may receive a bill from [facility] for your lab work.  Please note if you are on Hydroxychloroquine and and an order has been placed for a Hydroxychloroquine level, you will need to have it drawn 4 hours or more after your last dose.  If you wish to have your labs drawn at another location, please call the office 24 hours in advance so we can fax the orders.  The office is located at [address] [place], [place], [place] [address] No appointment is necessary.    If you have any questions regarding directions or hours of operation,  please call [phone].   As a reminder, please drink plenty of water prior to coming for your lab work. Thanks!   Vaccines You are taking a medication(s) that can suppress your immune system.  The following immunizations are recommended: Flu annually Covid-19  RSV Td/Tdap (tetanus, diphtheria, pertussis) every 10 years Pneumonia (Prevnar 15 then Pneumovax 23 at least 1 year apart.  Alternatively, can take Prevnar 20 without needing additional dose) Shingrix: 2 doses from 4 weeks  to 6 months apart  Please check with your PCP to make sure you are up to date.   If you have signs or symptoms of an infection or start antibiotics: First, call your PCP for workup of your infection. Hold your medication through the infection, until you complete your antibiotics, and until symptoms resolve if you take the following: Injectable medication (Actemra, Benlysta, Cimzia, Cosentyx, Enbrel, Humira, Kevzara, Orencia, Remicade, Simponi, Stelara, Taltz, Tremfya) Methotrexate Leflunomide (Arava) Mycophenolate (Cellcept) Roma Kayser, or Rinvoq  Get an annual skin examination to screen for skin cancer while you are on Enbrel. Heart Disease Prevention   Your inflammatory disease increases your risk of heart disease which includes heart attack, stroke, atrial fibrillation (irregular heartbeats), high blood pressure, heart failure and atherosclerosis (plaque in the arteries).  It is important to reduce your risk by:   Keep blood pressure, cholesterol, and blood sugar at healthy levels   Smoking Cessation   Maintain a healthy weight  BMI 20-25   Eat a healthy diet  Plenty of fresh fruit, vegetables, and whole grains  Limit saturated fats, foods high in sodium, and added sugars  DASH and Mediterranean diet   Increase physical activity  Recommend moderate physically activity for 150 minutes per week/ 30 minutes a day for five days a week These can be broken up into three separate ten-minute sessions during the day.   Reduce Stress  Meditation, slow breathing exercises, yoga, coloring books  Dental  visits twice a year

## 2022-07-07 NOTE — Progress Notes (Signed)
CBC and CMP are normal.

## 2022-07-08 LAB — QUANTIFERON-TB GOLD PLUS
Mitogen-NIL: 7.24 IU/mL
NIL: 0.03 IU/mL
QuantiFERON-TB Gold Plus: NEGATIVE
TB1-NIL: 0.04 IU/mL
TB2-NIL: 0.08 IU/mL

## 2022-07-08 LAB — CBC WITH DIFFERENTIAL/PLATELET
Absolute Monocytes: 384 cells/uL (ref 200–950)
Basophils Absolute: 40 cells/uL (ref 0–200)
Basophils Relative: 0.5 %
Eosinophils Absolute: 168 cells/uL (ref 15–500)
Eosinophils Relative: 2.1 %
HCT: 40.1 % (ref 35.0–45.0)
Hemoglobin: 13.9 g/dL (ref 11.7–15.5)
Lymphs Abs: 2272 cells/uL (ref 850–3900)
MCH: 30.7 pg (ref 27.0–33.0)
MCHC: 34.7 g/dL (ref 32.0–36.0)
MCV: 88.5 fL (ref 80.0–100.0)
MPV: 10.4 fL (ref 7.5–12.5)
Monocytes Relative: 4.8 %
Neutro Abs: 5136 cells/uL (ref 1500–7800)
Neutrophils Relative %: 64.2 %
Platelets: 302 10*3/uL (ref 140–400)
RBC: 4.53 10*6/uL (ref 3.80–5.10)
RDW: 11.4 % (ref 11.0–15.0)
Total Lymphocyte: 28.4 %
WBC: 8 10*3/uL (ref 3.8–10.8)

## 2022-07-08 LAB — COMPLETE METABOLIC PANEL WITH GFR
AG Ratio: 1.5 (calc) (ref 1.0–2.5)
ALT: 16 U/L (ref 6–29)
AST: 18 U/L (ref 10–35)
Albumin: 4.5 g/dL (ref 3.6–5.1)
Alkaline phosphatase (APISO): 71 U/L (ref 31–125)
BUN: 11 mg/dL (ref 7–25)
CO2: 27 mmol/L (ref 20–32)
Calcium: 9.3 mg/dL (ref 8.6–10.2)
Chloride: 103 mmol/L (ref 98–110)
Creat: 0.51 mg/dL (ref 0.50–0.99)
Globulin: 3 g/dL (calc) (ref 1.9–3.7)
Glucose, Bld: 85 mg/dL (ref 65–99)
Potassium: 4.1 mmol/L (ref 3.5–5.3)
Sodium: 139 mmol/L (ref 135–146)
Total Bilirubin: 0.6 mg/dL (ref 0.2–1.2)
Total Protein: 7.5 g/dL (ref 6.1–8.1)
eGFR: 114 mL/min/{1.73_m2} (ref >=60)

## 2022-07-09 NOTE — Progress Notes (Signed)
TB Gold is negative.

## 2022-07-12 DIAGNOSIS — D225 Melanocytic nevi of trunk: Secondary | ICD-10-CM | POA: Diagnosis not present

## 2022-07-12 DIAGNOSIS — L814 Other melanin hyperpigmentation: Secondary | ICD-10-CM | POA: Diagnosis not present

## 2022-07-12 DIAGNOSIS — L821 Other seborrheic keratosis: Secondary | ICD-10-CM | POA: Diagnosis not present

## 2022-08-13 ENCOUNTER — Other Ambulatory Visit: Payer: Self-pay | Admitting: *Deleted

## 2022-08-13 MED ORDER — ENBREL SURECLICK 50 MG/ML ~~LOC~~ SOAJ: SUBCUTANEOUS | 2 refills | Status: DC

## 2022-08-13 NOTE — Telephone Encounter (Signed)
Patient contacted the office and left message requesting a refill of Enbrel to be sent to the [facility].  Last Fill: 04/14/2023  Labs: 07/06/2022 CBC and CMP are normal.   TB Gold: 07/06/2022 Neg    Next Visit: 12/08/2022  Last Visit: 07/06/2022  [id number] arthritis of multiple sites without rheumatoid factor   Current Dose per office note [id number]: Enbrel SureClick 50 mg sq injections every 10 to 12 days   Okay to refill Enbrel?

## 2022-11-15 ENCOUNTER — Telehealth: Payer: Self-pay | Admitting: Pharmacist

## 2022-11-15 NOTE — Telephone Encounter (Addendum)
Submitted a Prior Authorization RENEWAL request to [facility] for ENBREL via CoverMyMeds.  Key: [id number]  Chesley Mires, PharmD, MPH, BCPS, CPP Clinical Pharmacist (Rheumatology and Pulmonology)

## 2022-11-16 NOTE — Telephone Encounter (Signed)
Received notification from [facility] regarding a prior authorization for ENBREL. Authorization has been APPROVED from 11/16/22 to 11/15/23. Approval letter sent to scan center.  Patient can continue to fill through [facility]: [phone]  Authorization # [id number]  Chesley Mires, PharmD, MPH, BCPS, CPP Clinical Pharmacist (Rheumatology and Pulmonology)

## 2022-11-17 DIAGNOSIS — M0600 Rheumatoid arthritis without rheumatoid factor, unspecified site: Secondary | ICD-10-CM | POA: Diagnosis not present

## 2022-11-17 DIAGNOSIS — F900 Attention-deficit hyperactivity disorder, predominantly inattentive type: Secondary | ICD-10-CM | POA: Diagnosis not present

## 2022-11-24 NOTE — Progress Notes (Deleted)
Office Visit Note  Patient: Michelle Pierce             Date of Birth: Sep 27, 1970           MRN: [id number]             PCP: Gordan Payment., MD Referring: Gordan Payment., MD Visit Date: 12/08/2022 Occupation: @GUAROCC @  Subjective:    History of Present Illness: Michelle Pierce is a 52 y.o. female with history of seronegative rheumatoid arthritis and osteoarthritis.  Patient remains on Enbrel SureClick 50 mg sq injections every 10 to 12 days.   CBC and CMP WNL 07/06/22. Orders for CBC and CMP released today. Her next lab work will be due in October and every 3 months. Standing orders for CBC and CMP remain in place.  TB gold negative 07/06/22.  No recent or recurrent infections. Discussed the importance of holding enbrel if she develops signs or symptoms of an infection and to resume once the infection has completely cleared.   Activities of Daily Living:  Patient reports morning stiffness for *** {minute/hour:19697}.   Patient {ACTIONS;DENIES/REPORTS:21021675::"Denies"} nocturnal pain.  Difficulty dressing/grooming: {ACTIONS;DENIES/REPORTS:21021675::"Denies"} Difficulty climbing stairs: {ACTIONS;DENIES/REPORTS:21021675::"Denies"} Difficulty getting out of chair: {ACTIONS;DENIES/REPORTS:21021675::"Denies"} Difficulty using hands for taps, buttons, cutlery, and/or writing: {ACTIONS;DENIES/REPORTS:21021675::"Denies"}  No Rheumatology ROS completed.   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   Malaise and fatigue 07/27/2018   Overweight 07/27/2018   Dyspnea 02/06/2018   Stress at home 02/06/2018   Primary osteoarthritis of both hips 12/30/2016   History of scoliosis 12/30/2016   Elevated LFTs 12/30/2016   Sicca  syndrome, unspecified 09/02/2016   Rheumatoid arthritis of multiple sites without rheumatoid factor (HCC) 04/08/2016   High risk medication use 04/08/2016   Osteoarthritis, 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    Past Medical History:  Diagnosis Date   Diverticulitis    per patient    Rheumatoid arthritis (HCC)     Family History  Problem Relation Age of Onset   Hypertension Mother    Irritable bowel syndrome Mother    Stomach cancer Father    Lung cancer Father    Diverticulitis Father    Sleep apnea Sister    Ulcerative colitis Son    Cancer Paternal Grandmother    Diverticulitis Paternal Grandmother    Hodgkin's lymphoma Paternal Grandmother    Prostate cancer Other        Aunts grandfather on dad side    Bone cancer Other    Diverticulitis Paternal Aunt        had colon surgery and colostomy    Past Surgical History:  Procedure Laterality Date   COLONOSCOPY  10/19/2019   [facility]. Mild diverticulosis was noted throughout the entire examined colon. The colon musoca was otherwise normal. Retroflexed views revealed internal Grade II second degree hemorrhoids.    KNEE ARTHROSCOPY Bilateral    Social History   Social History Narrative   Not on file   Immunization History  Administered Date(s) Administered   Influenza-Unspecified 03/07/2014, 02/24/2015, 02/22/2016   Moderna Sars-Covid-2 Vaccination 06/13/2020   PFIZER(Purple Top)SARS-COV-2 Vaccination 08/16/2019, 09/10/2019     Objective: Vital Signs: There were no vitals taken for this visit.   Physical Exam Vitals and nursing note reviewed.  Constitutional:      Appearance:  She is well-developed.  HENT:     Head: Normocephalic and atraumatic.  Eyes:     Conjunctiva/sclera: Conjunctivae normal.  Cardiovascular:     Rate and Rhythm: Normal rate and regular rhythm.     Heart sounds: Normal heart sounds.  Pulmonary:     Effort: Pulmonary effort is normal.     Breath sounds: Normal breath sounds.  Abdominal:     General:  Bowel sounds are normal.     Palpations: Abdomen is soft.  Musculoskeletal:     Cervical back: Normal range of motion.  Lymphadenopathy:     Cervical: No cervical adenopathy.  Skin:    General: Skin is warm and dry.     Capillary Refill: Capillary refill takes less than 2 seconds.  Neurological:     Mental Status: She is alert and oriented to person, place, and time.  Psychiatric:        Behavior: Behavior normal.      Musculoskeletal Exam: ***  CDAI Exam: CDAI Score: -- Patient Global: --; Provider Global: -- Swollen: --; Tender: -- Joint Exam 12/08/2022   No joint exam has been documented for this visit   There is currently no information documented on the homunculus. Go to the Rheumatology activity and complete the homunculus joint exam.  Investigation: No additional findings.  Imaging: No results found.  Recent Labs: Lab Results  Component Value Date   WBC 8.0 07/06/2022   HGB 13.9 07/06/2022   PLT 302 07/06/2022   NA 139 07/06/2022   K 4.1 07/06/2022   CL 103 07/06/2022   CO2 27 07/06/2022   GLUCOSE 85 07/06/2022   BUN 11 07/06/2022   CREATININE 0.51 07/06/2022   BILITOT 0.6 07/06/2022   ALKPHOS 88 02/27/2020   AST 18 07/06/2022   ALT 16 07/06/2022   PROT 7.5 07/06/2022   ALBUMIN 4.5 02/27/2020   CALCIUM 9.3 07/06/2022   GFRAA 127 11/26/2020   QFTBGOLDPLUS NEGATIVE 07/06/2022    Speciality Comments: No specialty comments available.  Procedures:  No procedures performed Allergies: Amoxicillin, Sulfa antibiotics, Gemifloxacin, and Penicillins   Assessment / Plan:     Visit Diagnoses: Rheumatoid arthritis of multiple sites without rheumatoid factor (HCC)  High risk medication use  Primary osteoarthritis of both hands  Primary osteoarthritis of both hips  History of scoliosis  Sicca syndrome (HCC)  Diverticulitis  Orders: No orders of the defined types were placed in this encounter.  No orders of the defined types were placed in this  encounter.     Follow-Up Instructions: No follow-ups on file.   Gearldine Bienenstock, PA-C  Note - This record has been created using Dragon software.  Chart creation errors have been sought, but may not always  have been located. Such creation errors do not reflect on  the standard of medical care.

## 2022-12-06 ENCOUNTER — Other Ambulatory Visit: Payer: Self-pay | Admitting: Internal Medicine

## 2022-12-06 NOTE — Telephone Encounter (Signed)
Last Fill: 08/13/2022  Labs: 07/06/2022 CBC and CMP are normal. I called patient, patient will have labs drawn at visit 12/08/2022.  TB Gold: 07/06/2022  TB Gold is negative.   Next Visit: 12/08/2022  Last Visit: 07/06/2022  [id number] arthritis of multiple sites without rheumatoid factor   Current Dose per office note 08/04/2022:  Enbrel SureClick 50 mg sq injections every 10 to 12 days.   Okay to refill Enbrel?

## 2022-12-08 ENCOUNTER — Ambulatory Visit: Payer: BC Managed Care – PPO | Admitting: Physician Assistant

## 2022-12-08 DIAGNOSIS — M0609 Rheumatoid arthritis without rheumatoid factor, multiple sites: Secondary | ICD-10-CM

## 2022-12-08 DIAGNOSIS — M160 Bilateral primary osteoarthritis of hip: Secondary | ICD-10-CM

## 2022-12-08 DIAGNOSIS — Z8739 Personal history of other diseases of the musculoskeletal system and connective tissue: Secondary | ICD-10-CM

## 2022-12-08 DIAGNOSIS — M19041 Primary osteoarthritis, right hand: Secondary | ICD-10-CM

## 2022-12-08 DIAGNOSIS — Z79899 Other long term (current) drug therapy: Secondary | ICD-10-CM

## 2022-12-08 DIAGNOSIS — K5792 Diverticulitis of intestine, part unspecified, without perforation or abscess without bleeding: Secondary | ICD-10-CM

## 2022-12-08 DIAGNOSIS — M3500 Sicca syndrome, unspecified: Secondary | ICD-10-CM

## 2022-12-15 NOTE — Progress Notes (Deleted)
Office Visit Note  Patient: Michelle Pierce             Date of Birth: January 29, 1971           MRN: [id number]             PCP: Gordan Payment., MD Referring: Gordan Payment., MD Visit Date: 12/29/2022 Occupation: @GUAROCC @  Subjective:    History of Present Illness: Michelle Pierce is a 52 y.o. female with history of seronegative rheumatoid arthritis and osteoarthritis.  Patient remains on Enbrel SureClick 50 mg sq injections every 10 to 12 days.   CBC and CMP WNL 07/06/22. Orders for CBC and CMP released today. Her next lab work will be due in October and every 3 months. Standing orders for CBC and CMP remain in place.  TB gold negative 07/06/22.  No recent or recurrent infections. Discussed the importance of holding enbrel if she develops signs or symptoms of an infection and to resume once the infection has completely cleared.   Activities of Daily Living:  Patient reports morning stiffness for *** {minute/hour:19697}.   Patient {ACTIONS;DENIES/REPORTS:21021675::"Denies"} nocturnal pain.  Difficulty dressing/grooming: {ACTIONS;DENIES/REPORTS:21021675::"Denies"} Difficulty climbing stairs: {ACTIONS;DENIES/REPORTS:21021675::"Denies"} Difficulty getting out of chair: {ACTIONS;DENIES/REPORTS:21021675::"Denies"} Difficulty using hands for taps, buttons, cutlery, and/or writing: {ACTIONS;DENIES/REPORTS:21021675::"Denies"}  No Rheumatology ROS completed.   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   Malaise and fatigue 07/27/2018   Overweight 07/27/2018   Dyspnea 02/06/2018   Stress at home 02/06/2018   Primary osteoarthritis of both hips 12/30/2016   History of scoliosis 12/30/2016   Elevated LFTs 12/30/2016   Sicca  syndrome, unspecified 09/02/2016   Rheumatoid arthritis of multiple sites without rheumatoid factor (HCC) 04/08/2016   High risk medication use 04/08/2016   Osteoarthritis, 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    Past Medical History:  Diagnosis Date   Diverticulitis    per patient    Rheumatoid arthritis (HCC)     Family History  Problem Relation Age of Onset   Hypertension Mother    Irritable bowel syndrome Mother    Stomach cancer Father    Lung cancer Father    Diverticulitis Father    Sleep apnea Sister    Ulcerative colitis Son    Cancer Paternal Grandmother    Diverticulitis Paternal Grandmother    Hodgkin's lymphoma Paternal Grandmother    Prostate cancer Other        Aunts grandfather on dad side    Bone cancer Other    Diverticulitis Paternal Aunt        had colon surgery and colostomy    Past Surgical History:  Procedure Laterality Date   COLONOSCOPY  10/19/2019   [facility]. Mild diverticulosis was noted throughout the entire examined colon. The colon musoca was otherwise normal. Retroflexed views revealed internal Grade II second degree hemorrhoids.    KNEE ARTHROSCOPY Bilateral    Social History   Social History Narrative   Not on file   Immunization History  Administered Date(s) Administered   Influenza-Unspecified 03/07/2014, 02/24/2015, 02/22/2016   Moderna Sars-Covid-2 Vaccination 06/13/2020   PFIZER(Purple Top)SARS-COV-2 Vaccination 08/16/2019, 09/10/2019     Objective: Vital Signs: There were no vitals taken for this visit.   Physical Exam Vitals and nursing note reviewed.  Constitutional:      Appearance:  She is well-developed.  HENT:     Head: Normocephalic and atraumatic.  Eyes:     Conjunctiva/sclera: Conjunctivae normal.  Cardiovascular:     Rate and Rhythm: Normal rate and regular rhythm.     Heart sounds: Normal heart sounds.  Pulmonary:     Effort: Pulmonary effort is normal.     Breath sounds: Normal breath sounds.  Abdominal:     General:  Bowel sounds are normal.     Palpations: Abdomen is soft.  Musculoskeletal:     Cervical back: Normal range of motion.  Lymphadenopathy:     Cervical: No cervical adenopathy.  Skin:    General: Skin is warm and dry.     Capillary Refill: Capillary refill takes less than 2 seconds.  Neurological:     Mental Status: She is alert and oriented to person, place, and time.  Psychiatric:        Behavior: Behavior normal.      Musculoskeletal Exam: ***  CDAI Exam: CDAI Score: -- Patient Global: --; Provider Global: -- Swollen: --; Tender: -- Joint Exam 12/29/2022   No joint exam has been documented for this visit   There is currently no information documented on the homunculus. Go to the Rheumatology activity and complete the homunculus joint exam.  Investigation: No additional findings.  Imaging: No results found.  Recent Labs: Lab Results  Component Value Date   WBC 8.0 07/06/2022   HGB 13.9 07/06/2022   PLT 302 07/06/2022   NA 139 07/06/2022   K 4.1 07/06/2022   CL 103 07/06/2022   CO2 27 07/06/2022   GLUCOSE 85 07/06/2022   BUN 11 07/06/2022   CREATININE 0.51 07/06/2022   BILITOT 0.6 07/06/2022   ALKPHOS 88 02/27/2020   AST 18 07/06/2022   ALT 16 07/06/2022   PROT 7.5 07/06/2022   ALBUMIN 4.5 02/27/2020   CALCIUM 9.3 07/06/2022   GFRAA 127 11/26/2020   QFTBGOLDPLUS NEGATIVE 07/06/2022    Speciality Comments: No specialty comments available.  Procedures:  No procedures performed Allergies: Amoxicillin, Sulfa antibiotics, Gemifloxacin, and Penicillins   Assessment / Plan:     Visit Diagnoses: Rheumatoid arthritis of multiple sites without rheumatoid factor (HCC)  High risk medication use - Enbrel SureClick 50 mg sq injections every 10 to 12 days.  Methotrexate discontinued due to elevated LFTs in June 2019.  Primary osteoarthritis of both hands  Primary osteoarthritis of both hips  History of scoliosis  Sicca syndrome (HCC)  Diverticulitis  BMI  27.0-27.9,adult  Orders: No orders of the defined types were placed in this encounter.  No orders of the defined types were placed in this encounter.     Follow-Up Instructions: No follow-ups on file.   Gearldine Bienenstock, PA-C  Note - This record has been created using Dragon software.  Chart creation errors have been sought, but may not always  have been located. Such creation errors do not reflect on  the standard of medical care.

## 2022-12-29 ENCOUNTER — Ambulatory Visit: Payer: BC Managed Care – PPO | Admitting: Physician Assistant

## 2022-12-29 DIAGNOSIS — M19042 Primary osteoarthritis, left hand: Secondary | ICD-10-CM

## 2022-12-29 DIAGNOSIS — Z79899 Other long term (current) drug therapy: Secondary | ICD-10-CM

## 2022-12-29 DIAGNOSIS — M3500 Sicca syndrome, unspecified: Secondary | ICD-10-CM

## 2022-12-29 DIAGNOSIS — Z6827 Body mass index (BMI) 27.0-27.9, adult: Secondary | ICD-10-CM

## 2022-12-29 DIAGNOSIS — M160 Bilateral primary osteoarthritis of hip: Secondary | ICD-10-CM

## 2022-12-29 DIAGNOSIS — Z8739 Personal history of other diseases of the musculoskeletal system and connective tissue: Secondary | ICD-10-CM

## 2022-12-29 DIAGNOSIS — M0609 Rheumatoid arthritis without rheumatoid factor, multiple sites: Secondary | ICD-10-CM

## 2022-12-29 DIAGNOSIS — K5792 Diverticulitis of intestine, part unspecified, without perforation or abscess without bleeding: Secondary | ICD-10-CM

## 2023-01-03 NOTE — Progress Notes (Deleted)
Office Visit Note  Patient: Michelle Pierce             Date of Birth: 1971-01-09           MRN: [id number]             PCP: Gordan Payment., MD Referring: Gordan Payment., MD Visit Date: 01/10/2023 Occupation: @GUAROCC @  Subjective:    History of Present Illness: [name] is a 52 y.o. female with history of seronegative rheumatoid arthritis and osteoarthritis.  Patient remains on Enbrel SureClick 50 mg sq injections every 10 to 12 days.   CBC and CMP WNL 07/06/22. Orders for CBC and CMP released today. Her next lab work will be due in November and every 3 months. Standing orders for CBC and CMP remain in place.  TB gold negative 07/06/22.  No recent or recurrent infections. Discussed the importance of holding enbrel if she develops signs or symptoms of an infection and to resume once the infection has completely cleared.   Activities of Daily Living:  Patient reports morning stiffness for *** {minute/hour:19697}.   Patient {ACTIONS;DENIES/REPORTS:21021675::"Denies"} nocturnal pain.  Difficulty dressing/grooming: {ACTIONS;DENIES/REPORTS:21021675::"Denies"} Difficulty climbing stairs: {ACTIONS;DENIES/REPORTS:21021675::"Denies"} Difficulty getting out of chair: {ACTIONS;DENIES/REPORTS:21021675::"Denies"} Difficulty using hands for taps, buttons, cutlery, and/or writing: {ACTIONS;DENIES/REPORTS:21021675::"Denies"}  No Rheumatology ROS completed.   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   Malaise and fatigue 07/27/2018   Overweight 07/27/2018   Dyspnea 02/06/2018   Stress at home 02/06/2018   Primary osteoarthritis of both hips 12/30/2016   History of scoliosis 12/30/2016   Elevated LFTs 12/30/2016   Sicca  syndrome, unspecified 09/02/2016   Rheumatoid arthritis of multiple sites without rheumatoid factor (HCC) 04/08/2016   High risk medication use 04/08/2016   Osteoarthritis, 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    Past Medical History:  Diagnosis Date   Diverticulitis    per patient    Rheumatoid arthritis (HCC)     Family History  Problem Relation Age of Onset   Hypertension Mother    Irritable bowel syndrome Mother    Stomach cancer Father    Lung cancer Father    Diverticulitis Father    Sleep apnea Sister    Ulcerative colitis Son    Cancer Paternal Grandmother    Diverticulitis Paternal Grandmother    Hodgkin's lymphoma Paternal Grandmother    Prostate cancer Other        Aunts grandfather on dad side    Bone cancer Other    Diverticulitis Paternal Aunt        had colon surgery and colostomy    Past Surgical History:  Procedure Laterality Date   COLONOSCOPY  10/19/2019   [facility]. Mild diverticulosis was noted throughout the entire examined colon. The colon musoca was otherwise normal. Retroflexed views revealed internal Grade II second degree hemorrhoids.    KNEE ARTHROSCOPY Bilateral    Social History   Social History Narrative   Not on file   Immunization History  Administered Date(s) Administered   Influenza-Unspecified 03/07/2014, 02/24/2015, 02/22/2016   Moderna Sars-Covid-2 Vaccination 06/13/2020   PFIZER(Purple Top)SARS-COV-2 Vaccination 08/16/2019, 09/10/2019     Objective: Vital Signs: There were no vitals taken for this visit.   Physical Exam Vitals and nursing note reviewed.  Constitutional:      Appearance:  She is well-developed.  HENT:     Head: Normocephalic and atraumatic.  Eyes:     Conjunctiva/sclera: Conjunctivae normal.  Cardiovascular:     Rate and Rhythm: Normal rate and regular rhythm.     Heart sounds: Normal heart sounds.  Pulmonary:     Effort: Pulmonary effort is normal.     Breath sounds: Normal breath sounds.  Abdominal:     General:  Bowel sounds are normal.     Palpations: Abdomen is soft.  Musculoskeletal:     Cervical back: Normal range of motion.  Lymphadenopathy:     Cervical: No cervical adenopathy.  Skin:    General: Skin is warm and dry.     Capillary Refill: Capillary refill takes less than 2 seconds.  Neurological:     Mental Status: She is alert and oriented to person, place, and time.  Psychiatric:        Behavior: Behavior normal.      Musculoskeletal Exam: ***  CDAI Exam: CDAI Score: -- Patient Global: --; Provider Global: -- Swollen: --; Tender: -- Joint Exam 01/10/2023   No joint exam has been documented for this visit   There is currently no information documented on the homunculus. Go to the Rheumatology activity and complete the homunculus joint exam.  Investigation: No additional findings.  Imaging: No results found.  Recent Labs: Lab Results  Component Value Date   WBC 8.0 07/06/2022   HGB 13.9 07/06/2022   PLT 302 07/06/2022   NA 139 07/06/2022   K 4.1 07/06/2022   CL 103 07/06/2022   CO2 27 07/06/2022   GLUCOSE 85 07/06/2022   BUN 11 07/06/2022   CREATININE 0.51 07/06/2022   BILITOT 0.6 07/06/2022   ALKPHOS 88 02/27/2020   AST 18 07/06/2022   ALT 16 07/06/2022   PROT 7.5 07/06/2022   ALBUMIN 4.5 02/27/2020   CALCIUM 9.3 07/06/2022   GFRAA 127 11/26/2020   QFTBGOLDPLUS NEGATIVE 07/06/2022    Speciality Comments: No specialty comments available.  Procedures:  No procedures performed Allergies: Amoxicillin, Sulfa antibiotics, Gemifloxacin, and Penicillins   Assessment / Plan:     Visit Diagnoses: Rheumatoid arthritis of multiple sites without rheumatoid factor (HCC)  High risk medication use - Enbrel SureClick 50 mg sq injections every 10 to 12 days.  Methotrexate discontinued due to elevated LFTs in June 2019.  Primary osteoarthritis of both hands  Primary osteoarthritis of both hips  History of scoliosis - She has adjustments by her chiropractor and  sees her massage therapist on a regular basis.  Sicca syndrome (HCC)  Diverticulitis  BMI 27.0-27.9,adult  Orders: No orders of the defined types were placed in this encounter.  No orders of the defined types were placed in this encounter.     Follow-Up Instructions: No follow-ups on file.   Gearldine Bienenstock, PA-C  Note - This record has been created using Dragon software.  Chart creation errors have been sought, but may not always  have been located. Such creation errors do not reflect on  the standard of medical care.

## 2023-01-10 ENCOUNTER — Ambulatory Visit: Payer: BC Managed Care – PPO | Admitting: Physician Assistant

## 2023-01-10 DIAGNOSIS — M3500 Sicca syndrome, unspecified: Secondary | ICD-10-CM

## 2023-01-10 DIAGNOSIS — M160 Bilateral primary osteoarthritis of hip: Secondary | ICD-10-CM

## 2023-01-10 DIAGNOSIS — Z8739 Personal history of other diseases of the musculoskeletal system and connective tissue: Secondary | ICD-10-CM

## 2023-01-10 DIAGNOSIS — M19042 Primary osteoarthritis, left hand: Secondary | ICD-10-CM

## 2023-01-10 DIAGNOSIS — Z79899 Other long term (current) drug therapy: Secondary | ICD-10-CM

## 2023-01-10 DIAGNOSIS — M0609 Rheumatoid arthritis without rheumatoid factor, multiple sites: Secondary | ICD-10-CM

## 2023-01-10 DIAGNOSIS — Z6827 Body mass index (BMI) 27.0-27.9, adult: Secondary | ICD-10-CM

## 2023-01-10 DIAGNOSIS — K5792 Diverticulitis of intestine, part unspecified, without perforation or abscess without bleeding: Secondary | ICD-10-CM

## 2023-01-10 NOTE — Progress Notes (Unsigned)
Office Visit Note  Patient: Michelle Pierce             Date of Birth: Feb 18, 1971           MRN: [id number]             PCP: Gordan Payment., MD Referring: Gordan Payment., MD Visit Date: 01/12/2023 Occupation: @GUAROCC @  Subjective:  Lower back pain   History of Present Illness: Michelle Pierce is a 52 y.o. female with history of seronegative rheumatoid arthritis and osteoarthritis.  Patient remains on Enbrel SureClick 50 mg sq injections every 10 days.  She is tolerating Enbrel without any side effects or injection site reactions.  She denies any signs or symptoms of a rheumatoid arthritis flare.  She is not currently experiencing any joint swelling.  Patient states that she is having increased discomfort in her lower back which started about 2 weeks ago.  Patient states that she was leaning over slightly at home several weeks ago which exacerbated her symptoms.  She [facility] the wellness center on [facility]/2023.  No updated x-rays were obtained at that time.  She was given a prednisone taper as well as a prescription for methocarbamol.  She completed the prednisone taper this morning.  Her symptoms have gradually been improving but she continues to have discomfort with bending and twisting.  Patient requested of update x-rays today.  She would like to see a spine specialist. She denies any new medical conditions.  She denies any recent or recurrent infections.    Activities of Daily Living:  Patient reports morning stiffness for 0 minutes.   Patient Denies nocturnal pain.  Difficulty dressing/grooming: Denies Difficulty climbing stairs: Denies Difficulty getting out of chair: Denies Difficulty using hands for taps, buttons, cutlery, and/or writing: Reports  Review of Systems  Constitutional:  Positive for fatigue.  HENT:  Positive for mouth dryness. Negative for mouth sores.   Eyes:  Positive for dryness.  Respiratory:  Negative for shortness of breath.   Cardiovascular:   Negative for chest pain and palpitations.  Gastrointestinal:  Negative for blood in stool, constipation and diarrhea.  Endocrine: Negative for increased urination.  Genitourinary:  Negative for involuntary urination.  Musculoskeletal:  Positive for joint pain and joint pain. Negative for gait problem, joint swelling, myalgias, muscle weakness, morning stiffness, muscle tenderness and myalgias.  Skin:  Positive for sensitivity to sunlight. Negative for color change, rash and hair loss.  Allergic/Immunologic: Negative for susceptible to infections.  Neurological:  Negative for dizziness and headaches.  Hematological:  Negative for swollen glands.  Psychiatric/Behavioral:  Negative for depressed mood and sleep disturbance. The patient is not nervous/anxious.     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   Malaise and fatigue 07/27/2018   Overweight 07/27/2018   Dyspnea 02/06/2018   Stress at home 02/06/2018   Primary osteoarthritis of both hips 12/30/2016   History of scoliosis 12/30/2016   Elevated LFTs 12/30/2016   Sicca syndrome, unspecified 09/02/2016   Rheumatoid arthritis of multiple sites without rheumatoid factor (HCC) 04/08/2016   High risk medication use 04/08/2016   Osteoarthritis, 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    Past Medical History:  Diagnosis Date   Diverticulitis    per patient    Rheumatoid arthritis (HCC)  Family History  Problem Relation Age of Onset   Hypertension Mother    Irritable bowel syndrome Mother    Stomach cancer Father    Lung cancer Father    Diverticulitis Father    Sleep apnea Sister    Ulcerative colitis Son    Cancer Paternal Grandmother    Diverticulitis Paternal Grandmother    Hodgkin's  lymphoma Paternal Grandmother    Prostate cancer Other        Aunts grandfather on dad side    Bone cancer Other    Diverticulitis Paternal Aunt        had colon surgery and colostomy    Past Surgical History:  Procedure Laterality Date   COLONOSCOPY  10/19/2019   [facility]. Mild diverticulosis was noted throughout the entire examined colon. The colon musoca was otherwise normal. Retroflexed views revealed internal Grade II second degree hemorrhoids.    KNEE ARTHROSCOPY Bilateral    Social History   Social History Narrative   Not on file   Immunization History  Administered Date(s) Administered   Influenza-Unspecified 03/07/2014, 02/24/2015, 02/22/2016   Moderna Sars-Covid-2 Vaccination 06/13/2020   PFIZER(Purple Top)SARS-COV-2 Vaccination 08/16/2019, 09/10/2019     Objective: Vital Signs: BP 129/81 (BP Location: Left Arm, Patient Position: Sitting, Cuff Size: Normal)   Pulse 78   Resp 14   Ht 5\' 6"  (1.676 m)   Wt 168 lb (76.2 kg)   BMI 27.12 kg/m    Physical Exam Vitals and nursing note reviewed.  Constitutional:      Appearance: She is well-developed.  HENT:     Head: Normocephalic and atraumatic.  Eyes:     Conjunctiva/sclera: Conjunctivae normal.  Cardiovascular:     Rate and Rhythm: Normal rate and regular rhythm.     Heart sounds: Normal heart sounds.  Pulmonary:     Effort: Pulmonary effort is normal.     Breath sounds: Normal breath sounds.  Abdominal:     General: Bowel sounds are normal.     Palpations: Abdomen is soft.  Musculoskeletal:     Cervical back: Normal range of motion.  Lymphadenopathy:     Cervical: No cervical adenopathy.  Skin:    General: Skin is warm and dry.     Capillary Refill: Capillary refill takes less than 2 seconds.  Neurological:     Mental Status: She is alert and oriented to person, place, and time.  Psychiatric:        Behavior: Behavior normal.      Musculoskeletal Exam: C-spine has good ROM.  Thoracolumbar  scoliosis.  Midline spinal tenderness in the lumbar region.  Mild tenderness over the left SI joints.  Shoulder joints, elbow joints, wrist joints, MCPs, PIPs, and DIPs good ROM with no synovitis.  Hip joints have good ROM with no groin pain.  Knee joints have good ROM with no warmth or effusion.  Ankle joints have good ROM with no tenderness or joint swelling.   CDAI Exam: CDAI Score: -- Patient Global: 20 / 100; Provider Global: 20 / 100 Swollen: --; Tender: -- Joint Exam 01/12/2023   No joint exam has been documented for this visit   There is currently no information documented on the homunculus. Go to the Rheumatology activity and complete the homunculus joint exam.  Investigation: No additional findings.  Imaging: XR Pelvis 1-2 Views  Result Date: 01/12/2023 No SI joint sclerosis or narrowing was noted.  Mild narrowing of the left hip joint was noted. Impression: Unremarkable x-rays of the  SI joints.  Possible early osteoarthritic changes were noted in the left hip joint.  XR Lumbar Spine 2-3 Views  Result Date: 01/12/2023 Levoscoliosis was noted.  L3-L4 and L4-L5 narrowing was noted.  Facet joint and foraminal narrowing was noted.  No significant change was noted when compared to the x-rays of 2022. Impression: These findings are suggestive of multilevel spondylosis and facet joint arthropathy.   Recent Labs: Lab Results  Component Value Date   WBC 8.0 07/06/2022   HGB 13.9 07/06/2022   PLT 302 07/06/2022   NA 139 07/06/2022   K 4.1 07/06/2022   CL 103 07/06/2022   CO2 27 07/06/2022   GLUCOSE 85 07/06/2022   BUN 11 07/06/2022   CREATININE 0.51 07/06/2022   BILITOT 0.6 07/06/2022   ALKPHOS 88 02/27/2020   AST 18 07/06/2022   ALT 16 07/06/2022   PROT 7.5 07/06/2022   ALBUMIN 4.5 02/27/2020   CALCIUM 9.3 07/06/2022   GFRAA 127 11/26/2020   QFTBGOLDPLUS NEGATIVE 07/06/2022    Speciality Comments: No specialty comments available.  Procedures:  No procedures  performed Allergies: Amoxicillin, Sulfa antibiotics, Gemifloxacin, and Penicillins     Assessment / Plan:     Visit Diagnoses: Rheumatoid arthritis of multiple sites without rheumatoid factor (HCC): She has no synovitis on examination today.  She has not had any signs or symptoms of a rheumatoid arthritis flare.  She has clinically been doing well on Enbrel 50 mg subcu days injections every 10 days.  She is tolerating Enbrel without any side effects or injection site reactions.  She has not had any increased joint pain or inflammation while spacing the dosing of Enbrel.  No medication changes will be made at this time.  She was advised to notify us if she develops signs or symptoms of a flare.  She will follow-up in the office in 5 months or sooner if needed.  High risk medication use -Enbrel 50 mg sq injections every 10 days.   CBC and CMP WNL 07/06/22. Orders for CBC and CMP released today. Her next lab work will be due in November and every 3 months. Standing orders for CBC and CMP remain in place.  TB gold negative 07/06/22.  No recent or recurrent infections. Discussed the importance of holding enbrel if she develops signs or symptoms of an infection and to resume once the infection has completely cleared.   Plan: CBC with Differential/Platelet, COMPLETE METABOLIC PANEL WITH GFR  Primary osteoarthritis of both hands: Mild PIP and DIP thickening consistent with osteoarthritis of both hands.  No active inflammation noted at this time.  Complete fist formation bilaterally.  Primary osteoarthritis of both hips: Patient has good range of motion of both hip joints on examination today.  Mild discomfort in the left hip noted.  History of scoliosis: X-rays of the lumbar spine were updated today which were consistent with levoscoliosis.  No significant changes noted when compared to x-rays from 2022.  A referral to Cyndia Skeeters will be placed today as requested.   Chronic midline low back pain with  left-sided sciatica -patient presents today with increased pain and stiffness in her lower back.  She has had increased discomfort over the past 2 weeks.  No recent injury or fall.  Her symptoms started 5 performing household activities.  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  Patient was evaluated at the wellness clinic on 01/05/2023 at which time she was prescribed a course of prednisone and methocarbamol.  Her symptoms have gradually started to improve.  Most of her discomfort is on the left side of her lower back.  Patient requested to have updated x-rays today which were consistent with multilevel spondylosis, levoscoliosis, and facet joint arthropathy.  Patient plans on performing back exercises and core strengthening.  She has requested a referral to [facility] as well.  She was previously a patient of Dr. Otelia Sergeant who has retired. plan: XR Lumbar Spine 2-3 Views, XR Pelvis 1-2 Views  Sicca syndrome [facility]): Chronic   Other medical conditions are listed as follows:   Diverticulitis  BMI 27.0-27.9,adult   Orders: Orders Placed This Encounter  Procedures   XR Lumbar Spine 2-3 Views   XR Pelvis 1-2 Views   CBC with Differential/Platelet   COMPLETE METABOLIC PANEL WITH GFR   No orders of the defined types were placed in this encounter.     Follow-Up Instructions: Return in about 5 months (around 06/14/2023) for Rheumatoid arthritis.   Gearldine Bienenstock, PA-C  Note - This record has been created using Dragon software.  Chart creation errors have been sought, but may not always  have been located. Such creation errors do not reflect on  the standard of medical care.

## 2023-01-12 ENCOUNTER — Ambulatory Visit (INDEPENDENT_AMBULATORY_CARE_PROVIDER_SITE_OTHER): Payer: BC Managed Care – PPO

## 2023-01-12 ENCOUNTER — Ambulatory Visit: Payer: BC Managed Care – PPO | Attending: Physician Assistant | Admitting: Physician Assistant

## 2023-01-12 ENCOUNTER — Encounter: Payer: Self-pay | Admitting: Physician Assistant

## 2023-01-12 ENCOUNTER — Other Ambulatory Visit: Payer: Self-pay | Admitting: *Deleted

## 2023-01-12 VITALS — BP 129/81 | HR 78 | Resp 14 | Ht 66.0 in | Wt 168.0 lb

## 2023-01-12 DIAGNOSIS — M19042 Primary osteoarthritis, left hand: Secondary | ICD-10-CM

## 2023-01-12 DIAGNOSIS — M3500 Sicca syndrome, unspecified: Secondary | ICD-10-CM

## 2023-01-12 DIAGNOSIS — K5792 Diverticulitis of intestine, part unspecified, without perforation or abscess without bleeding: Secondary | ICD-10-CM

## 2023-01-12 DIAGNOSIS — M5442 Lumbago with sciatica, left side: Secondary | ICD-10-CM

## 2023-01-12 DIAGNOSIS — G8929 Other chronic pain: Secondary | ICD-10-CM

## 2023-01-12 DIAGNOSIS — M160 Bilateral primary osteoarthritis of hip: Secondary | ICD-10-CM

## 2023-01-12 DIAGNOSIS — M21372 Foot drop, left foot: Secondary | ICD-10-CM

## 2023-01-12 DIAGNOSIS — Z79899 Other long term (current) drug therapy: Secondary | ICD-10-CM | POA: Diagnosis not present

## 2023-01-12 DIAGNOSIS — M0609 Rheumatoid arthritis without rheumatoid factor, multiple sites: Secondary | ICD-10-CM | POA: Diagnosis not present

## 2023-01-12 DIAGNOSIS — Z8739 Personal history of other diseases of the musculoskeletal system and connective tissue: Secondary | ICD-10-CM

## 2023-01-12 DIAGNOSIS — Z6827 Body mass index (BMI) 27.0-27.9, adult: Secondary | ICD-10-CM

## 2023-01-12 DIAGNOSIS — M19041 Primary osteoarthritis, right hand: Secondary | ICD-10-CM | POA: Diagnosis not present

## 2023-01-12 LAB — CBC WITH DIFFERENTIAL/PLATELET
Absolute Monocytes: 255 cells/uL (ref 200–950)
Basophils Absolute: 41 cells/uL (ref 0–200)
Basophils Relative: 0.4 %
Eosinophils Absolute: 10 cells/uL — ABNORMAL LOW (ref 15–500)
Eosinophils Relative: 0.1 %
HCT: 41.8 % (ref 35.0–45.0)
Hemoglobin: 14.1 g/dL (ref 11.7–15.5)
Lymphs Abs: 949 cells/uL (ref 850–3900)
MCH: 29.6 pg (ref 27.0–33.0)
MCHC: 33.7 g/dL (ref 32.0–36.0)
MCV: 87.8 fL (ref 80.0–100.0)
MPV: 10.2 fL (ref 7.5–12.5)
Monocytes Relative: 2.5 %
Neutro Abs: 8945 cells/uL — ABNORMAL HIGH (ref 1500–7800)
Neutrophils Relative %: 87.7 %
Platelets: 329 10*3/uL (ref 140–400)
RBC: 4.76 10*6/uL (ref 3.80–5.10)
RDW: 11.7 % (ref 11.0–15.0)
Total Lymphocyte: 9.3 %
WBC: 10.2 10*3/uL (ref 3.8–10.8)

## 2023-01-12 LAB — COMPLETE METABOLIC PANEL WITH GFR
AG Ratio: 1.6 (calc) (ref 1.0–2.5)
ALT: 13 U/L (ref 6–29)
AST: 14 U/L (ref 10–35)
Albumin: 4.6 g/dL (ref 3.6–5.1)
Alkaline phosphatase (APISO): 68 U/L (ref 37–153)
BUN: 15 mg/dL (ref 7–25)
CO2: 27 mmol/L (ref 20–32)
Calcium: 9.6 mg/dL (ref 8.6–10.4)
Chloride: 100 mmol/L (ref 98–110)
Creat: 0.72 mg/dL (ref 0.50–1.03)
Globulin: 2.8 g/dL (calc) (ref 1.9–3.7)
Glucose, Bld: 109 mg/dL — ABNORMAL HIGH (ref 65–99)
Potassium: 4.3 mmol/L (ref 3.5–5.3)
Sodium: 138 mmol/L (ref 135–146)
Total Bilirubin: 0.5 mg/dL (ref 0.2–1.2)
Total Protein: 7.4 g/dL (ref 6.1–8.1)
eGFR: 102 mL/min/{1.73_m2} (ref >=60)

## 2023-01-12 NOTE — Patient Instructions (Signed)

## 2023-01-12 NOTE — Progress Notes (Signed)
X-rays of the lumbar spine are consistent with levoscoliosis, multilevel spondylosis, and facet joint arthropathy. No significant change noted compared to 2022.   X-rays of SI joints are unremarkable.  Mild narrowing noted in left hip joint.   Please notify the patient and place referral to [facility] as requested.

## 2023-01-13 ENCOUNTER — Telehealth: Payer: Self-pay

## 2023-01-13 NOTE — Telephone Encounter (Signed)
Patient had requested a refill of enbrel pending lab results. Refill was sent to the pharmacy on 12/06/2022 for a quantity of 4 mLs with 2 refills. I called patient and advised. Patient will contact [facility] for a refill.

## 2023-01-13 NOTE — Progress Notes (Signed)
Glucose is 109.  Rest of CMP WNL.  Absolute neutrophils are elevated-patient completed a prednisone taper yesterday.

## 2023-05-17 ENCOUNTER — Other Ambulatory Visit: Payer: Self-pay | Admitting: Physician Assistant

## 2023-05-17 DIAGNOSIS — Z111 Encounter for screening for respiratory tuberculosis: Secondary | ICD-10-CM

## 2023-05-17 DIAGNOSIS — Z9225 Personal history of immunosupression therapy: Secondary | ICD-10-CM

## 2023-05-17 DIAGNOSIS — Z13220 Encounter for screening for lipoid disorders: Secondary | ICD-10-CM

## 2023-05-17 DIAGNOSIS — Z79899 Other long term (current) drug therapy: Secondary | ICD-10-CM

## 2023-05-19 NOTE — Telephone Encounter (Signed)
Last Fill: 12/06/2022  Labs: 01/12/2023 Glucose is 109.  Rest of CMP WNL. Absolute neutrophils are elevated-  TB Gold: 07/06/2022   Next Visit: 06/15/2023  Last Visit: 01/12/2023  FA:[id number] arthritis of multiple sites without rheumatoid factor   Current Dose per office note 01/12/2023: Enbrel 50 mg sq injections every 10 days.   Okay to refill Enbrel?

## 2023-05-30 ENCOUNTER — Telehealth: Payer: Self-pay | Admitting: Rheumatology

## 2023-05-30 DIAGNOSIS — Z79899 Other long term (current) drug therapy: Secondary | ICD-10-CM

## 2023-05-30 DIAGNOSIS — Z111 Encounter for screening for respiratory tuberculosis: Secondary | ICD-10-CM

## 2023-05-30 DIAGNOSIS — Z9225 Personal history of immunosupression therapy: Secondary | ICD-10-CM

## 2023-05-30 DIAGNOSIS — Z13220 Encounter for screening for lipoid disorders: Secondary | ICD-10-CM

## 2023-05-30 NOTE — Addendum Note (Signed)
 Addended by: Henriette Combs on: 05/30/2023 03:12 PM   Modules accepted: Orders

## 2023-05-30 NOTE — Telephone Encounter (Signed)
 Patient contacted the office requesting lab orders to be be release to [facility].   Patient plans to have labs on Tuesday, 05/31/23.

## 2023-05-30 NOTE — Telephone Encounter (Signed)
 Lab Orders released.

## 2023-06-01 NOTE — Progress Notes (Deleted)
 Office Visit Note  Patient: Michelle Pierce             Date of Birth: May 02, 1971           MRN: [id number]             PCP: Gordan Payment., MD Referring: Gordan Payment., MD Visit Date: 06/15/2023 Occupation: @GUAROCC @  Subjective:  No chief complaint on file.   History of Present Illness: Michelle Pierce is a 53 y.o. female ***     Activities of Daily Living:  Patient reports morning stiffness for *** {minute/hour:19697}.   Patient {ACTIONS;DENIES/REPORTS:21021675::"Denies"} nocturnal pain.  Difficulty dressing/grooming: {ACTIONS;DENIES/REPORTS:21021675::"Denies"} Difficulty climbing stairs: {ACTIONS;DENIES/REPORTS:21021675::"Denies"} Difficulty getting out of chair: {ACTIONS;DENIES/REPORTS:21021675::"Denies"} Difficulty using hands for taps, buttons, cutlery, and/or writing: {ACTIONS;DENIES/REPORTS:21021675::"Denies"}  No Rheumatology ROS completed.   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   Malaise and fatigue 07/27/2018   Overweight 07/27/2018   Dyspnea 02/06/2018   Stress at home 02/06/2018   Primary osteoarthritis of both hips 12/30/2016   History of scoliosis 12/30/2016   Elevated LFTs 12/30/2016   Sicca syndrome (HCC) 09/02/2016   Rheumatoid arthritis of multiple sites without rheumatoid factor (HCC) 04/08/2016   High risk medication use 04/08/2016   Osteoarthritis, 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    Past Medical History:  Diagnosis Date   Diverticulitis    per patient    Rheumatoid arthritis (HCC)     Family History  Problem Relation Age of Onset   Hypertension Mother    Irritable bowel syndrome Mother    Stomach cancer Father    Lung cancer Father    Diverticulitis Father    Sleep apnea Sister     Ulcerative colitis Son    Cancer Paternal Grandmother    Diverticulitis Paternal Grandmother    Hodgkin's lymphoma Paternal Grandmother    Prostate cancer Other        Aunts grandfather on dad side    Bone cancer Other    Diverticulitis Paternal Aunt        had colon surgery and colostomy    Past Surgical History:  Procedure Laterality Date   COLONOSCOPY  10/19/2019   [facility]. Mild diverticulosis was noted throughout the entire examined colon. The colon musoca was otherwise normal. Retroflexed views revealed internal Grade II second degree hemorrhoids.    KNEE ARTHROSCOPY Bilateral    Social History   Social History Narrative   Not on file   Immunization History  Administered Date(s) Administered   Influenza-Unspecified 03/07/2014, 02/24/2015, 02/22/2016   Moderna Sars-Covid-2 Vaccination 06/13/2020   PFIZER(Purple Top)SARS-COV-2 Vaccination 08/16/2019, 09/10/2019     Objective: Vital Signs: There were no vitals taken for this visit.   Physical Exam   Musculoskeletal Exam: ***  CDAI Exam: CDAI Score: -- Patient Global: --; Provider Global: -- Swollen: --; Tender: -- Joint Exam 06/15/2023   No joint exam has been documented for this visit   There is currently no information documented on the homunculus. Go to the Rheumatology activity and complete the homunculus joint exam.  Investigation: No additional findings.  Imaging: No results found.  Recent Labs: Lab Results  Component Value Date   WBC 10.2 01/12/2023   HGB 14.1 01/12/2023   PLT 329 01/12/2023   NA 138 01/12/2023   K  4.3 01/12/2023   CL 100 01/12/2023   CO2 27 01/12/2023   GLUCOSE 109 (H) 01/12/2023   BUN 15 01/12/2023   CREATININE 0.72 01/12/2023   BILITOT 0.5 01/12/2023   ALKPHOS 88 02/27/2020   AST 14 01/12/2023   ALT 13 01/12/2023   PROT 7.4 01/12/2023   ALBUMIN 4.5 02/27/2020   CALCIUM 9.6 01/12/2023   GFRAA 127 11/26/2020   QFTBGOLDPLUS NEGATIVE 07/06/2022    Speciality  Comments: No specialty comments available.  Procedures:  No procedures performed Allergies: Amoxicillin, Sulfa antibiotics, Gemifloxacin, and Penicillins   Assessment / Plan:     Visit Diagnoses: Rheumatoid arthritis of multiple sites without rheumatoid factor (HCC)  High risk medication use  Sicca syndrome (HCC)  Primary osteoarthritis of both hands  Chronic midline low back pain with left-sided sciatica  Left foot drop  Primary osteoarthritis of both hips  History of scoliosis  Diverticulitis  Orders: No orders of the defined types were placed in this encounter.  No orders of the defined types were placed in this encounter.   Face-to-face time spent with patient was *** minutes. Greater than 50% of time was spent in counseling and coordination of care.  Follow-Up Instructions: No follow-ups on file.   Gearldine Bienenstock, PA-C  Note - This record has been created using Dragon software.  Chart creation errors have been sought, but may not always  have been located. Such creation errors do not reflect on  the standard of medical care.

## 2023-06-15 ENCOUNTER — Ambulatory Visit: Payer: BC Managed Care – PPO | Admitting: Rheumatology

## 2023-06-15 DIAGNOSIS — M3500 Sicca syndrome, unspecified: Secondary | ICD-10-CM

## 2023-06-15 DIAGNOSIS — Z8739 Personal history of other diseases of the musculoskeletal system and connective tissue: Secondary | ICD-10-CM

## 2023-06-15 DIAGNOSIS — K5792 Diverticulitis of intestine, part unspecified, without perforation or abscess without bleeding: Secondary | ICD-10-CM

## 2023-06-15 DIAGNOSIS — M19041 Primary osteoarthritis, right hand: Secondary | ICD-10-CM

## 2023-06-15 DIAGNOSIS — M160 Bilateral primary osteoarthritis of hip: Secondary | ICD-10-CM

## 2023-06-15 DIAGNOSIS — Z79899 Other long term (current) drug therapy: Secondary | ICD-10-CM

## 2023-06-15 DIAGNOSIS — M21372 Foot drop, left foot: Secondary | ICD-10-CM

## 2023-06-15 DIAGNOSIS — M0609 Rheumatoid arthritis without rheumatoid factor, multiple sites: Secondary | ICD-10-CM

## 2023-06-15 DIAGNOSIS — G8929 Other chronic pain: Secondary | ICD-10-CM

## 2023-06-23 NOTE — Progress Notes (Signed)
Office Visit Note  Patient: Michelle Pierce             Date of Birth: August 31, 1970           MRN: [id number]             PCP: Gordan Payment., MD Referring: Gordan Payment., MD Visit Date: 07/07/2023 Occupation: @GUAROCC @  Subjective:  Medication management  History of Present Illness: Michelle Pierce is a 53 y.o. female with seronegative rheumatoid arthritis and osteoarthritis.  Patient states she has been doing well without any increased joint pain or joint swelling.  She denies any flares since her last visit.  She states the neck and lower back has been doing well without any increased discomfort.  She states she has been doing well on Enbrel 50 mg subcu every 10 days.  She denies any interruption in the treatment.  She denies any side effects from Enbrel.    Activities of Daily Living:  Patient reports morning stiffness for 0 minute.   Patient Denies nocturnal pain.  Difficulty dressing/grooming: Denies Difficulty climbing stairs: Denies Difficulty getting out of chair: Denies Difficulty using hands for taps, buttons, cutlery, and/or writing: Reports  Review of Systems  Constitutional:  Positive for fatigue.  HENT:  Positive for mouth dryness. Negative for mouth sores.   Eyes:  Positive for dryness.  Respiratory:  Negative for shortness of breath.   Cardiovascular:  Negative for chest pain and palpitations.  Gastrointestinal:  Negative for blood in stool, constipation and diarrhea.  Endocrine: Negative for increased urination.  Genitourinary:  Negative for involuntary urination.  Musculoskeletal:  Positive for joint pain and joint pain. Negative for gait problem, joint swelling, myalgias, muscle weakness, morning stiffness, muscle tenderness and myalgias.  Skin:  Positive for sensitivity to sunlight. Negative for color change, rash and hair loss.  Allergic/Immunologic: Negative for susceptible to infections.  Neurological:  Negative for dizziness and headaches.   Hematological:  Negative for swollen glands.  Psychiatric/Behavioral:  Negative for depressed mood and sleep disturbance. The patient is not nervous/anxious.     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   Malaise and fatigue 07/27/2018   Overweight 07/27/2018   Dyspnea 02/06/2018   Stress at home 02/06/2018   Primary osteoarthritis of both hips 12/30/2016   History of scoliosis 12/30/2016   Elevated LFTs 12/30/2016   Sicca syndrome (HCC) 09/02/2016   Rheumatoid arthritis of multiple sites without rheumatoid factor (HCC) 04/08/2016   High risk medication use 04/08/2016   Osteoarthritis, 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    Past Medical History:  Diagnosis Date   Diverticulitis    per patient    Rheumatoid arthritis (HCC)     Family History  Problem Relation Age of Onset   Hypertension Mother    Irritable bowel syndrome Mother    Stomach cancer Father    Lung cancer Father    Diverticulitis Father    Sleep apnea Sister    Ulcerative colitis Son    Cancer Paternal Grandmother    Diverticulitis Paternal Grandmother    Hodgkin's lymphoma Paternal Grandmother    Prostate cancer Other        Aunts grandfather on dad side    Bone cancer Other    Diverticulitis Paternal Aunt  had colon surgery and colostomy    Past Surgical History:  Procedure Laterality Date   COLONOSCOPY  10/19/2019   [facility]. Mild diverticulosis was noted throughout the entire examined colon. The colon musoca was otherwise normal. Retroflexed views revealed internal Grade II second degree hemorrhoids.    KNEE ARTHROSCOPY Bilateral    Social History   Social History Narrative   Not on file   Immunization History  Administered Date(s) Administered    Influenza-Unspecified 03/07/2014, 02/24/2015, 02/22/2016   Moderna Sars-Covid-2 Vaccination 06/13/2020   PFIZER(Purple Top)SARS-COV-2 Vaccination 08/16/2019, 09/10/2019     Objective: Vital Signs: BP 114/76 (BP Location: Left Arm, Patient Position: Sitting, Cuff Size: Large)   Pulse 71   Resp 12   Ht 5\' 5"  (1.651 m)   Wt 172 lb (78 kg)   BMI 28.62 kg/m    Physical Exam Vitals and nursing note reviewed.  Constitutional:      Appearance: She is well-developed.  HENT:     Head: Normocephalic and atraumatic.  Eyes:     Conjunctiva/sclera: Conjunctivae normal.  Cardiovascular:     Rate and Rhythm: Normal rate and regular rhythm.     Heart sounds: Normal heart sounds.  Pulmonary:     Effort: Pulmonary effort is normal.     Breath sounds: Normal breath sounds.  Abdominal:     General: Bowel sounds are normal.     Palpations: Abdomen is soft.  Musculoskeletal:     Cervical back: Normal range of motion.  Lymphadenopathy:     Cervical: No cervical adenopathy.  Skin:    General: Skin is warm and dry.     Capillary Refill: Capillary refill takes less than 2 seconds.  Neurological:     Mental Status: She is alert and oriented to person, place, and time.  Psychiatric:        Behavior: Behavior normal.      Musculoskeletal Exam: She had good range of motion of the cervical spine.  Thoracolumbar scoliosis without any tenderness was noted.  Shoulders, elbows, wrists, MCPs PIPs and DIPs were in good range of motion with no synovitis.  Hip joints and knee joints with good range of motion without any warmth swelling or effusion.  There was no tenderness over ankles or MTPs.  No synovitis was noted on the examination.  CDAI Exam: CDAI Score: -- Patient Global: 0 / 100; Provider Global: 0 / 100 Swollen: --; Tender: -- Joint Exam 07/07/2023   No joint exam has been documented for this visit   There is currently no information documented on the homunculus. Go to the Rheumatology  activity and complete the homunculus joint exam.  Investigation: No additional findings.  Imaging: No results found.  Recent Labs: Lab Results  Component Value Date   WBC 10.2 01/12/2023   HGB 14.1 01/12/2023   PLT 329 01/12/2023   NA 138 01/12/2023   K 4.3 01/12/2023   CL 100 01/12/2023   CO2 27 01/12/2023   GLUCOSE 109 (H) 01/12/2023   BUN 15 01/12/2023   CREATININE 0.72 01/12/2023   BILITOT 0.5 01/12/2023   ALKPHOS 88 02/27/2020   AST 14 01/12/2023   ALT 13 01/12/2023   PROT 7.4 01/12/2023   ALBUMIN 4.5 02/27/2020   CALCIUM 9.6 01/12/2023   GFRAA 127 11/26/2020   QFTBGOLDPLUS NEGATIVE 07/06/2022    Speciality Comments: No specialty comments available.  Procedures:  No procedures performed Allergies: Amoxicillin, Sulfa antibiotics, Gemifloxacin, and Penicillins   Assessment / Plan:  Visit Diagnoses: Rheumatoid arthritis of multiple sites without rheumatoid factor (HCC)-patient denies any discomfort today.  She has not had any flares since last visit.  She had no synovitis on the examination.  She has been taking Enbrel on a regular basis without any interruption.  High risk medication use - Enbrel 50 mg sq injections every 10 days.  January 12, 2023 CBC and CMP were normal.  TB Gold was negative on July 06, 2022.  Will check labs today and every 3 months.  TB Gold will be checked annually.  She was advised to hold Enbrel if she develops an infection resume after the infection resolves.  Information on immunization was placed in the AVS.  Annual skin examination was advised to monitor for skin cancer while she is on Enbrel.  Use of sunscreen and sun protection was discussed.- Plan: CBC with Differential/Platelet, COMPLETE METABOLIC PANEL WITH GFR, QuantiFERON-TB Gold Plus  Primary osteoarthritis of both hands-she continue to have some stiffness in her hands.  No synovitis was noted.  Primary osteoarthritis of both hips-both hips were in good range of motion without  any discomfort.  History of scoliosis - X-rays of the lumbar spine were updated today which were consistent with levoscoliosis.  No significant changes noted when compared to x-rays from 2022.  Chronic midline low back pain with left-sided sciatica - She was previously a patient of Dr. Otelia Sergeant who has retired.  She denies any discomfort today.  Sicca syndrome (HCC)-she uses over-the-counter products and Restasis eyedrops.  Diverticulitis-patient denies any flares.  BMI 28.62  Orders: Orders Placed This Encounter  Procedures   CBC with Differential/Platelet   COMPLETE METABOLIC PANEL WITH GFR   QuantiFERON-TB Gold Plus   No orders of the defined types were placed in this encounter.    Follow-Up Instructions: Return in about 5 months (around 12/04/2023) for Rheumatoid arthritis.   Pollyann Savoy, MD  Note - This record has been created using Animal nutritionist.  Chart creation errors have been sought, but may not always  have been located. Such creation errors do not reflect on  the standard of medical care.

## 2023-06-28 ENCOUNTER — Other Ambulatory Visit: Payer: Self-pay | Admitting: Physician Assistant

## 2023-06-28 NOTE — Telephone Encounter (Signed)
 Last Fill: 05/19/2023  Labs: 05/30/2023 Potassium 5.2, Creat. 0.59  TB Gold: 07/06/2022 Neg    Next Visit: 07/07/2023  Last Visit: 01/12/2023  IK:Myzlfjunpi arthritis of multiple sites without rheumatoid factor   Current Dose per office note 01/12/2023: Enbrel  50 mg sq injections every 10 days.   Okay to refill Enbrel ?

## 2023-07-07 ENCOUNTER — Ambulatory Visit: Payer: BC Managed Care – PPO | Attending: Rheumatology | Admitting: Rheumatology

## 2023-07-07 ENCOUNTER — Encounter: Payer: Self-pay | Admitting: Rheumatology

## 2023-07-07 VITALS — BP 114/76 | HR 71 | Resp 12 | Ht 65.0 in | Wt 172.0 lb

## 2023-07-07 DIAGNOSIS — M160 Bilateral primary osteoarthritis of hip: Secondary | ICD-10-CM | POA: Diagnosis not present

## 2023-07-07 DIAGNOSIS — Z6827 Body mass index (BMI) 27.0-27.9, adult: Secondary | ICD-10-CM

## 2023-07-07 DIAGNOSIS — K5792 Diverticulitis of intestine, part unspecified, without perforation or abscess without bleeding: Secondary | ICD-10-CM

## 2023-07-07 DIAGNOSIS — M0609 Rheumatoid arthritis without rheumatoid factor, multiple sites: Secondary | ICD-10-CM

## 2023-07-07 DIAGNOSIS — M5442 Lumbago with sciatica, left side: Secondary | ICD-10-CM

## 2023-07-07 DIAGNOSIS — Z79899 Other long term (current) drug therapy: Secondary | ICD-10-CM

## 2023-07-07 DIAGNOSIS — Z6828 Body mass index (BMI) 28.0-28.9, adult: Secondary | ICD-10-CM

## 2023-07-07 DIAGNOSIS — M3500 Sicca syndrome, unspecified: Secondary | ICD-10-CM

## 2023-07-07 DIAGNOSIS — M19041 Primary osteoarthritis, right hand: Secondary | ICD-10-CM

## 2023-07-07 DIAGNOSIS — G8929 Other chronic pain: Secondary | ICD-10-CM

## 2023-07-07 DIAGNOSIS — Z8739 Personal history of other diseases of the musculoskeletal system and connective tissue: Secondary | ICD-10-CM

## 2023-07-07 DIAGNOSIS — M19042 Primary osteoarthritis, left hand: Secondary | ICD-10-CM

## 2023-07-07 NOTE — Patient Instructions (Signed)
Standing Labs We placed an order today for your standing lab work.   Please have your standing labs drawn in May and every 3 months  Please have your labs drawn 2 weeks prior to your appointment so that the provider can discuss your lab results at your appointment, if possible.  Please note that you may see your imaging and lab results in MyChart before we have reviewed them. We will contact you once all results are reviewed. Please allow our office up to 72 hours to thoroughly review all of the results before contacting the office for clarification of your results.  WALK-IN LAB HOURS  Monday through Thursday from 8:00 am -12:30 pm and 1:00 pm-5:00 pm and Friday from 8:00 am-12:00 pm.  Patients with office visits requiring labs will be seen before walk-in labs.  You may encounter longer than normal wait times. Please allow additional time. Wait times may be shorter on  Monday and Thursday afternoons.  We do not book appointments for walk-in labs. We appreciate your patience and understanding with our staff.   Labs are drawn by [facility]. Please bring your co-pay at the time of your lab draw.  You may receive a bill from [facility] for your lab work.  Please note if you are on Hydroxychloroquine and and an order has been placed for a Hydroxychloroquine level,  you will need to have it drawn 4 hours or more after your last dose.  If you wish to have your labs drawn at another location, please call the office 24 hours in advance so we can fax the orders.  The office is located at [address], [address], [place], [place] [address]   If you have any questions regarding directions or hours of operation,  please call [phone].   As a reminder, please drink plenty of water prior to coming for your lab work. Thanks!   Vaccines You are taking a medication(s) that can suppress your immune system.  The following immunizations are recommended: Flu annually Covid-19  RSV Td/Tdap (tetanus,  diphtheria, pertussis) every 10 years Pneumonia (Prevnar 15 then Pneumovax 23 at least 1 year apart.  Alternatively, can take Prevnar 20 without needing additional dose) Shingrix: 2 doses from 4 weeks to 6 months apart  Please check with your PCP to make sure you are up to date.   If you have signs or symptoms of an infection or start antibiotics: First, call your PCP for workup of your infection. Hold your medication through the infection, until you complete your antibiotics, and until symptoms resolve if you take the following: Injectable medication (Actemra, Benlysta, Cimzia, Cosentyx, Enbrel, Humira, Kevzara, Orencia, Remicade, Simponi, Stelara, Taltz, Tremfya) Methotrexate Leflunomide (Arava) Mycophenolate (Cellcept) Harriette Ohara, Olumiant, or Rinvoq   Please get an annual skin examination to screen for skin cancer while you are on Enbrel.  Please use sunscreen and sun protection.

## 2023-07-08 ENCOUNTER — Telehealth: Payer: Self-pay | Admitting: *Deleted

## 2023-07-08 DIAGNOSIS — Z79899 Other long term (current) drug therapy: Secondary | ICD-10-CM

## 2023-07-08 NOTE — Progress Notes (Signed)
Liver functions are elevated.  Patient should avoid all NSAIDs and alcohol use.  Please ask if patient took any recent antibiotics or NSAIDs.  Repeat AST and ALT in 1 month.  TB Gold is pending.

## 2023-07-08 NOTE — Telephone Encounter (Signed)
-----   Message from [facility] sent at 07/08/2023  2:18 PM EST ----- Liver functions are elevated.  Patient should avoid all NSAIDs and alcohol use.  Please ask if patient took any recent antibiotics or NSAIDs.  Repeat AST and ALT in 1 month.  TB Gold is pending.

## 2023-07-10 LAB — COMPLETE METABOLIC PANEL WITH GFR
AG Ratio: 1.4 (calc) (ref 1.0–2.5)
ALT: 52 U/L — ABNORMAL HIGH (ref 6–29)
AST: 37 U/L — ABNORMAL HIGH (ref 10–35)
Albumin: 4.3 g/dL (ref 3.6–5.1)
Alkaline phosphatase (APISO): 93 U/L (ref 37–153)
BUN: 10 mg/dL (ref 7–25)
CO2: 22 mmol/L (ref 20–32)
Calcium: 9.4 mg/dL (ref 8.6–10.4)
Chloride: 102 mmol/L (ref 98–110)
Creat: 0.6 mg/dL (ref 0.50–1.03)
Globulin: 3 g/dL (ref 1.9–3.7)
Glucose, Bld: 91 mg/dL (ref 65–99)
Potassium: 4.5 mmol/L (ref 3.5–5.3)
Sodium: 138 mmol/L (ref 135–146)
Total Bilirubin: 0.4 mg/dL (ref 0.2–1.2)
Total Protein: 7.3 g/dL (ref 6.1–8.1)
eGFR: 109 mL/min/{1.73_m2} (ref >=60)

## 2023-07-10 LAB — CBC WITH DIFFERENTIAL/PLATELET
Absolute Lymphocytes: 2102 {cells}/uL (ref 850–3900)
Absolute Monocytes: 462 {cells}/uL (ref 200–950)
Basophils Absolute: 43 {cells}/uL (ref 0–200)
Basophils Relative: 0.6 %
Eosinophils Absolute: 227 {cells}/uL (ref 15–500)
Eosinophils Relative: 3.2 %
HCT: 41.2 % (ref 35.0–45.0)
Hemoglobin: 13.5 g/dL (ref 11.7–15.5)
MCH: 29.4 pg (ref 27.0–33.0)
MCHC: 32.8 g/dL (ref 32.0–36.0)
MCV: 89.8 fL (ref 80.0–100.0)
MPV: 10.5 fL (ref 7.5–12.5)
Monocytes Relative: 6.5 %
Neutro Abs: 4267 {cells}/uL (ref 1500–7800)
Neutrophils Relative %: 60.1 %
Platelets: 286 10*3/uL (ref 140–400)
RBC: 4.59 10*6/uL (ref 3.80–5.10)
RDW: 11.4 % (ref 11.0–15.0)
Total Lymphocyte: 29.6 %
WBC: 7.1 10*3/uL (ref 3.8–10.8)

## 2023-07-10 LAB — QUANTIFERON-TB GOLD PLUS
Mitogen-NIL: 7.25 [IU]/mL
NIL: 0.03 [IU]/mL
QuantiFERON-TB Gold Plus: NEGATIVE
TB1-NIL: 0.08 [IU]/mL
TB2-NIL: 0.09 [IU]/mL

## 2023-07-11 NOTE — Progress Notes (Signed)
 TB Gold is negative.

## 2023-08-22 ENCOUNTER — Other Ambulatory Visit: Payer: Self-pay | Admitting: *Deleted

## 2023-08-22 MED ORDER — PREDNISONE 5 MG PO TABS: ORAL_TABLET | ORAL | 0 refills | Status: AC

## 2023-08-22 MED ORDER — ENBREL SURECLICK 50 MG/ML ~~LOC~~ SOAJ: 50.0000 mg | SUBCUTANEOUS | Status: DC

## 2023-08-22 NOTE — Telephone Encounter (Signed)
 Patient contacted the office and states she is having a flare. Patient states she started having pain in her fingers and it has now moved to her wrists. Patient states she is having pain in her feet and toes. Patient states she is having swelling in her first finger. Patient states she is taking Enbrel every 10 days and her last injection was 08/16/2023. Patient would like to know if she can have a prescription for a prednisone taper. Please advise.

## 2023-08-22 NOTE — Telephone Encounter (Signed)
 Patient advised okay to send prescription for prednisone taper starting at 20 mg and taper by 5 mg every 4 days. Patient advised that prednisone can cause weight gain, hypertension, diabetes, cataracts, heart disease, increased risk of osteoporosis. Patient should also increase the frequency of Enbrel 50 mg subcu every 7 days.

## 2023-08-22 NOTE — Addendum Note (Signed)
 Addended by: Henriette Combs on: 08/22/2023 09:43 AM   Modules accepted: Orders

## 2023-08-22 NOTE — Telephone Encounter (Signed)
 Okay to send prescription for prednisone taper starting at 20 mg and taper by 5 mg every 4 days.  Please advise patient that prednisone can cause weight gain, hypertension, diabetes, cataracts, heart disease, increased risk of osteoporosis.  Patient should also increase the frequency of Enbrel 50 mg subcu every 7 days.

## 2023-08-29 ENCOUNTER — Other Ambulatory Visit: Payer: Self-pay | Admitting: Rheumatology

## 2023-08-29 NOTE — Telephone Encounter (Signed)
 Last Fill: 08/13/2022  Labs: 07/07/2023 Liver functions are elevated. CBC WNL  TB Gold: 07/07/2023  Neg   Next Visit: 12/06/2023  Last Visit: 07/07/2023  DX: Rheumatoid arthritis of multiple sites without rheumatoid factor   Current Dose per phone note 08/22/2023: increase the frequency of Enbrel 50 mg subcu every 7 days.   Okay to refill Enbrel?

## 2023-09-28 ENCOUNTER — Telehealth: Payer: Self-pay | Admitting: Rheumatology

## 2023-09-28 ENCOUNTER — Encounter: Payer: Self-pay | Admitting: *Deleted

## 2023-09-28 NOTE — Telephone Encounter (Signed)
 yes

## 2023-09-28 NOTE — Telephone Encounter (Signed)
 Patient called stating she is travelling and needs a letter from Dr. Alvira Josephs so she can bring her Enbrel  medication on the flight.  Patient is leaving on 10/04/23.    Patient is also asking if the office has any travel bags with ice.  Patient states she has two separate flights one is 4 hours and the other is 6 hours.

## 2023-09-28 NOTE — Telephone Encounter (Signed)
 Reached out to patient to advised we could provide the letter. Sent letter to patient via my chart. Patient advised we do not have any travel bags for the Enbrel . Patient states she has been in touch with [employer] and they advised the would send her one.

## 2023-11-07 ENCOUNTER — Other Ambulatory Visit: Payer: Self-pay | Admitting: Rheumatology

## 2023-11-07 DIAGNOSIS — Z79899 Other long term (current) drug therapy: Secondary | ICD-10-CM

## 2023-11-07 NOTE — Telephone Encounter (Signed)
 Last Fill: 08/29/2023   Labs: 07/07/2023 Liver functions are elevated. CBC WNL   TB Gold: 07/07/2023  Neg    Next Visit: 12/06/2023   Last Visit: 07/07/2023   DX: Rheumatoid arthritis of multiple sites without rheumatoid factor    Current Dose per phone note 08/22/2023: increase the frequency of Enbrel  50 mg subcu every 7 days.   Patient advised she is due to update labs. Patient states she will update them this week. Lab orders released.    Okay to refill Enbrel ?

## 2023-11-17 ENCOUNTER — Telehealth: Payer: Self-pay | Admitting: Pharmacist

## 2023-11-17 NOTE — Telephone Encounter (Signed)
 Submitted a Prior Authorization RENEWAL request to [facility] for ENBREL  via CoverMyMeds. Will update once we receive a response.  Key: [facility]

## 2023-11-18 NOTE — Telephone Encounter (Signed)
 Received notification from [facility] regarding a prior authorization for ENBREL . Authorization has been APPROVED from 11/17/2023 to 10/27/2024. Approval letter sent to scan center.  Authorization # [id number]  Sherry Pennant, PharmD, MPH, BCPS, CPP Clinical Pharmacist (Rheumatology and Pulmonology)

## 2023-11-19 LAB — CBC WITH DIFFERENTIAL/PLATELET
Basophils Absolute: 0.1 10*3/uL (ref 0.0–0.2)
Basos: 1 %
EOS (ABSOLUTE): 0.3 10*3/uL (ref 0.0–0.4)
Eos: 4 %
Hematocrit: 40.2 % (ref 34.0–46.6)
Hemoglobin: 13.5 g/dL (ref 11.1–15.9)
Immature Grans (Abs): 0 10*3/uL (ref 0.0–0.1)
Immature Granulocytes: 0 %
Lymphocytes Absolute: 2.4 10*3/uL (ref 0.7–3.1)
Lymphs: 27 %
MCH: 30 pg (ref 26.6–33.0)
MCHC: 33.6 g/dL (ref 31.5–35.7)
MCV: 89 fL (ref 79–97)
Monocytes Absolute: 0.5 10*3/uL (ref 0.1–0.9)
Monocytes: 6 %
Neutrophils Absolute: 5.5 10*3/uL (ref 1.4–7.0)
Neutrophils: 62 %
Platelets: 316 10*3/uL (ref 150–450)
RBC: 4.5 x10E6/uL (ref 3.77–5.28)
RDW: 11.4 % — ABNORMAL LOW (ref 11.7–15.4)
WBC: 8.8 10*3/uL (ref 3.4–10.8)

## 2023-11-19 LAB — CMP14+EGFR
ALT: 14 IU/L (ref 0–32)
AST: 19 IU/L (ref 0–40)
Albumin: 4.1 g/dL (ref 3.8–4.9)
Alkaline Phosphatase: 82 IU/L (ref 44–121)
BUN/Creatinine Ratio: 16 (ref 9–23)
BUN: 11 mg/dL (ref 6–24)
Bilirubin Total: 0.3 mg/dL (ref 0.0–1.2)
CO2: 22 mmol/L (ref 20–29)
Calcium: 9.6 mg/dL (ref 8.7–10.2)
Chloride: 101 mmol/L (ref 96–106)
Creatinine, Ser: 0.69 mg/dL (ref 0.57–1.00)
Globulin, Total: 2.7 g/dL (ref 1.5–4.5)
Glucose: 110 mg/dL — ABNORMAL HIGH (ref 70–99)
Potassium: 4.7 mmol/L (ref 3.5–5.2)
Sodium: 137 mmol/L (ref 134–144)
Total Protein: 6.8 g/dL (ref 6.0–8.5)
eGFR: 105 mL/min/{1.73_m2} (ref >59)

## 2023-11-20 ENCOUNTER — Ambulatory Visit: Payer: Self-pay | Admitting: Physician Assistant

## 2023-11-20 NOTE — Progress Notes (Signed)
CBC stable. CMP WNL.

## 2023-11-22 NOTE — Progress Notes (Deleted)
 Office Visit Note  Patient: Michelle Pierce             Date of Birth: 1970-08-17           MRN: [id number]             PCP: Thurmond Cathlyn LABOR., MD Referring: Thurmond Cathlyn LABOR., MD Visit Date: 12/06/2023 Occupation: @GUAROCC @  Subjective:    History of Present Illness: APHRODITE HARPENAU is a 53 y.o. female with history of seronegative rheumatoid arthritis and osteoarthritis.  Patient remains on Enbrel  50 mg sq injections every 10 days.    CBC and CMP updated on 11/18/23  TB gold negative on 07/07/23.  Discussed the importance of holding enbrel  if she develops signs or symptoms of an infection and to resume once the infection has completely cleared.     Activities of Daily Living:  Patient reports morning stiffness for *** {minute/hour:19697}.   Patient {ACTIONS;DENIES/REPORTS:21021675::Denies} nocturnal pain.  Difficulty dressing/grooming: {ACTIONS;DENIES/REPORTS:21021675::Denies} Difficulty climbing stairs: {ACTIONS;DENIES/REPORTS:21021675::Denies} Difficulty getting out of chair: {ACTIONS;DENIES/REPORTS:21021675::Denies} Difficulty using hands for taps, buttons, cutlery, and/or writing: {ACTIONS;DENIES/REPORTS:21021675::Denies}  No Rheumatology ROS completed.   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   Malaise and fatigue 07/27/2018   Overweight 07/27/2018   Dyspnea 02/06/2018   Stress at home 02/06/2018   Primary osteoarthritis of both hips 12/30/2016   History of scoliosis 12/30/2016   Elevated LFTs 12/30/2016   Sicca syndrome (HCC) 09/02/2016   Rheumatoid arthritis of multiple sites without rheumatoid factor (HCC) 04/08/2016   High risk medication use 04/08/2016   Osteoarthritis, 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     Past Medical History:  Diagnosis Date   Diverticulitis    per patient    Rheumatoid arthritis (HCC)     Family History  Problem Relation Age of Onset   Hypertension Mother    Irritable bowel syndrome Mother    Stomach cancer Father    Lung cancer Father    Diverticulitis Father    Sleep apnea Sister    Ulcerative colitis Son    Cancer Paternal Grandmother    Diverticulitis Paternal Grandmother    Hodgkin's lymphoma Paternal Grandmother    Prostate cancer Other        Aunts grandfather on dad side    Bone cancer Other    Diverticulitis Paternal Aunt        had colon surgery and colostomy    Past Surgical History:  Procedure Laterality Date   COLONOSCOPY  10/19/2019   [facility]. Mild diverticulosis was noted throughout the entire examined colon. The colon musoca was otherwise normal. Retroflexed views revealed internal Grade II second degree hemorrhoids.    KNEE ARTHROSCOPY Bilateral    Social History   Social History Narrative   Not on file   Immunization History  Administered Date(s) Administered   Influenza-Unspecified 03/07/2014, 02/24/2015, 02/22/2016   Moderna Sars-Covid-2 Vaccination 06/13/2020   PFIZER(Purple Top)SARS-COV-2 Vaccination 08/16/2019, 09/10/2019     Objective: Vital Signs: There were no vitals taken for this visit.   Physical Exam Vitals and nursing note reviewed.  Constitutional:      Appearance: She is well-developed.  HENT:     Head: Normocephalic and atraumatic.  Eyes:     Conjunctiva/sclera: Conjunctivae normal.  Cardiovascular:     Rate and Rhythm: Normal  rate and regular rhythm.     Heart sounds: Normal heart sounds.  Pulmonary:     Effort: Pulmonary effort is normal.     Breath sounds: Normal breath sounds.  Abdominal:     General: Bowel sounds are normal.     Palpations: Abdomen is soft.  Musculoskeletal:     Cervical back: Normal range of motion.  Lymphadenopathy:     Cervical: No cervical adenopathy.  Skin:     General: Skin is warm and dry.     Capillary Refill: Capillary refill takes less than 2 seconds.  Neurological:     Mental Status: She is alert and oriented to person, place, and time.  Psychiatric:        Behavior: Behavior normal.      Musculoskeletal Exam: ***  CDAI Exam: CDAI Score: -- Patient Global: --; Provider Global: -- Swollen: --; Tender: -- Joint Exam 12/06/2023   No joint exam has been documented for this visit   There is currently no information documented on the homunculus. Go to the Rheumatology activity and complete the homunculus joint exam.  Investigation: No additional findings.  Imaging: No results found.  Recent Labs: Lab Results  Component Value Date   WBC 8.8 11/18/2023   HGB 13.5 11/18/2023   PLT 316 11/18/2023   NA 137 11/18/2023   K 4.7 11/18/2023   CL 101 11/18/2023   CO2 22 11/18/2023   GLUCOSE 110 (H) 11/18/2023   BUN 11 11/18/2023   CREATININE 0.69 11/18/2023   BILITOT 0.3 11/18/2023   ALKPHOS 82 11/18/2023   AST 19 11/18/2023   ALT 14 11/18/2023   PROT 6.8 11/18/2023   ALBUMIN 4.1 11/18/2023   CALCIUM 9.6 11/18/2023   GFRAA 127 11/26/2020   QFTBGOLDPLUS NEGATIVE 07/07/2023    Speciality Comments: No specialty comments available.  Procedures:  No procedures performed Allergies: Amoxicillin, Sulfa antibiotics, Gemifloxacin, and Penicillins   Assessment / Plan:     Visit Diagnoses: Rheumatoid arthritis of multiple sites without rheumatoid factor (HCC)  High risk medication use  Primary osteoarthritis of both hands  Primary osteoarthritis of both hips  History of scoliosis  Chronic midline low back pain with left-sided sciatica  Sicca syndrome (HCC)  Diverticulitis  Orders: No orders of the defined types were placed in this encounter.  No orders of the defined types were placed in this encounter.   Face-to-face time spent with patient was *** minutes. Greater than 50% of time was spent in counseling and  coordination of care.  Follow-Up Instructions: No follow-ups on file.   Waddell CHRISTELLA Craze, PA-C  Note - This record has been created using Dragon software.  Chart creation errors have been sought, but may not always  have been located. Such creation errors do not reflect on  the standard of medical care.

## 2023-12-06 ENCOUNTER — Ambulatory Visit: Payer: BC Managed Care – PPO | Admitting: Physician Assistant

## 2023-12-06 DIAGNOSIS — G8929 Other chronic pain: Secondary | ICD-10-CM

## 2023-12-06 DIAGNOSIS — Z79899 Other long term (current) drug therapy: Secondary | ICD-10-CM

## 2023-12-06 DIAGNOSIS — M0609 Rheumatoid arthritis without rheumatoid factor, multiple sites: Secondary | ICD-10-CM

## 2023-12-06 DIAGNOSIS — M3500 Sicca syndrome, unspecified: Secondary | ICD-10-CM

## 2023-12-06 DIAGNOSIS — M160 Bilateral primary osteoarthritis of hip: Secondary | ICD-10-CM

## 2023-12-06 DIAGNOSIS — M19042 Primary osteoarthritis, left hand: Secondary | ICD-10-CM

## 2023-12-06 DIAGNOSIS — Z8739 Personal history of other diseases of the musculoskeletal system and connective tissue: Secondary | ICD-10-CM

## 2023-12-06 DIAGNOSIS — K5792 Diverticulitis of intestine, part unspecified, without perforation or abscess without bleeding: Secondary | ICD-10-CM

## 2023-12-08 ENCOUNTER — Encounter: Payer: Self-pay | Admitting: Rheumatology

## 2023-12-08 ENCOUNTER — Ambulatory Visit: Attending: Rheumatology | Admitting: Rheumatology

## 2023-12-08 VITALS — BP 118/73 | HR 82 | Resp 16 | Ht 66.0 in | Wt 165.2 lb

## 2023-12-08 DIAGNOSIS — M160 Bilateral primary osteoarthritis of hip: Secondary | ICD-10-CM

## 2023-12-08 DIAGNOSIS — M19042 Primary osteoarthritis, left hand: Secondary | ICD-10-CM

## 2023-12-08 DIAGNOSIS — Z8739 Personal history of other diseases of the musculoskeletal system and connective tissue: Secondary | ICD-10-CM

## 2023-12-08 DIAGNOSIS — Z79899 Other long term (current) drug therapy: Secondary | ICD-10-CM

## 2023-12-08 DIAGNOSIS — G8929 Other chronic pain: Secondary | ICD-10-CM

## 2023-12-08 DIAGNOSIS — M3500 Sicca syndrome, unspecified: Secondary | ICD-10-CM

## 2023-12-08 DIAGNOSIS — Z6826 Body mass index (BMI) 26.0-26.9, adult: Secondary | ICD-10-CM

## 2023-12-08 DIAGNOSIS — M0609 Rheumatoid arthritis without rheumatoid factor, multiple sites: Secondary | ICD-10-CM | POA: Diagnosis not present

## 2023-12-08 DIAGNOSIS — M19041 Primary osteoarthritis, right hand: Secondary | ICD-10-CM

## 2023-12-08 DIAGNOSIS — E785 Hyperlipidemia, unspecified: Secondary | ICD-10-CM

## 2023-12-08 DIAGNOSIS — K5792 Diverticulitis of intestine, part unspecified, without perforation or abscess without bleeding: Secondary | ICD-10-CM

## 2023-12-08 DIAGNOSIS — M0800 Unspecified juvenile rheumatoid arthritis of unspecified site: Secondary | ICD-10-CM

## 2023-12-08 DIAGNOSIS — M5442 Lumbago with sciatica, left side: Secondary | ICD-10-CM

## 2023-12-08 NOTE — Progress Notes (Deleted)
 Office Visit Note  Patient: Michelle Pierce             Date of Birth: 12-03-1970           MRN: [id number]             PCP: Thurmond Cathlyn LABOR., MD Referring: Thurmond Cathlyn LABOR., MD Visit Date: 12/08/2023 Occupation: @GUAROCC @  Subjective:  Follow-up   History of Present Illness: Michelle Pierce is a 53 y.o. female ***     Activities of Daily Living:  Patient reports morning stiffness for  none.   Patient Denies nocturnal pain.  Difficulty dressing/grooming: Denies Difficulty climbing stairs: Denies Difficulty getting out of chair: Denies Difficulty using hands for taps, buttons, cutlery, and/or writing: Denies  Review of Systems  Constitutional:  Positive for fatigue.  HENT:  Positive for mouth dryness. Negative for mouth sores.   Eyes:  Positive for dryness.  Respiratory:  Negative for shortness of breath.   Cardiovascular:  Negative for chest pain and palpitations.  Gastrointestinal:  Negative for blood in stool, constipation and diarrhea.  Endocrine: Negative for increased urination.  Genitourinary:  Negative for involuntary urination.  Musculoskeletal:  Positive for joint pain, joint pain and muscle weakness. Negative for gait problem, joint swelling, myalgias, morning stiffness, muscle tenderness and myalgias.  Skin:  Negative for color change, rash, hair loss and sensitivity to sunlight.  Allergic/Immunologic: Negative for susceptible to infections.  Neurological:  Negative for dizziness and headaches.  Hematological:  Negative for swollen glands.  Psychiatric/Behavioral:  Negative for depressed mood and sleep disturbance. The patient is not nervous/anxious.     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    Malaise and fatigue 07/27/2018   Overweight 07/27/2018   Dyspnea 02/06/2018   Stress at home 02/06/2018   Primary osteoarthritis of both hips 12/30/2016   History of scoliosis 12/30/2016   Elevated LFTs 12/30/2016   Sicca syndrome (HCC) 09/02/2016   Rheumatoid arthritis of multiple sites without rheumatoid factor (HCC) 04/08/2016   High risk medication use 04/08/2016   Osteoarthritis, 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    Past Medical History:  Diagnosis Date   Diverticulitis    per patient    Rheumatoid arthritis (HCC)     Family History  Problem Relation Age of Onset   Hypertension Mother    Irritable bowel syndrome Mother    Stomach cancer Father    Lung cancer Father    Diverticulitis Father    Sleep apnea Sister    Ulcerative colitis Son    Cancer Paternal Grandmother    Diverticulitis Paternal Grandmother    Hodgkin's lymphoma Paternal Grandmother    Prostate cancer Other        Aunts grandfather on dad side    Bone cancer Other    Diverticulitis Paternal Aunt        had colon surgery and colostomy    Past Surgical History:  Procedure Laterality Date   COLONOSCOPY  10/19/2019   [facility]. Mild diverticulosis was noted throughout the entire examined colon. The colon musoca was otherwise normal. Retroflexed views revealed internal Grade II second degree hemorrhoids.    KNEE ARTHROSCOPY Bilateral    Social History   Social History Narrative   Not on file   Immunization History  Administered  Date(s) Administered   Influenza-Unspecified 03/07/2014, 02/24/2015, 02/22/2016   Moderna Sars-Covid-2 Vaccination 06/13/2020   PFIZER(Purple Top)SARS-COV-2 Vaccination 08/16/2019, 09/10/2019     Objective: Vital Signs: BP 118/73 (BP Location: Left Arm, Patient Position: Sitting, Cuff Size: Normal)   Pulse 82   Resp 16   Ht 5' 6 (1.676 m)   Wt 165 lb 3.2 oz (74.9 kg)   BMI 26.66 kg/m    Physical Exam   Musculoskeletal Exam: ***  CDAI Exam: CDAI Score: -- Patient Global:  --; Provider Global: -- Swollen: --; Tender: -- Joint Exam 12/08/2023   No joint exam has been documented for this visit   There is currently no information documented on the homunculus. Go to the Rheumatology activity and complete the homunculus joint exam.  Investigation: No additional findings.  Imaging: No results found.  Recent Labs: Lab Results  Component Value Date   WBC 8.8 11/18/2023   HGB 13.5 11/18/2023   PLT 316 11/18/2023   NA 137 11/18/2023   K 4.7 11/18/2023   CL 101 11/18/2023   CO2 22 11/18/2023   GLUCOSE 110 (H) 11/18/2023   BUN 11 11/18/2023   CREATININE 0.69 11/18/2023   BILITOT 0.3 11/18/2023   ALKPHOS 82 11/18/2023   AST 19 11/18/2023   ALT 14 11/18/2023   PROT 6.8 11/18/2023   ALBUMIN 4.1 11/18/2023   CALCIUM 9.6 11/18/2023   GFRAA 127 11/26/2020   QFTBGOLDPLUS NEGATIVE 07/07/2023    Speciality Comments: No specialty comments available.  Procedures:  No procedures performed Allergies: Amoxicillin, Sulfa antibiotics, Gemifloxacin, and Penicillins   Assessment / Plan:     Visit Diagnoses: No diagnosis found.  Orders: No orders of the defined types were placed in this encounter.  No orders of the defined types were placed in this encounter.   Face-to-face time spent with patient was *** minutes. Greater than 50% of time was spent in counseling and coordination of care.  Follow-Up Instructions: No follow-ups on file.   Sintia Mckissic M Thaila Bottoms, CMA  Note - This record has been created using Animal nutritionist.  Chart creation errors have been sought, but may not always  have been located. Such creation errors do not reflect on  the standard of medical care.

## 2023-12-08 NOTE — Progress Notes (Signed)
 Office Visit Note  Patient: Michelle Pierce             Date of Birth: August 04, 1970           MRN: [id number]             PCP: Thurmond Cathlyn LABOR., MD Referring: Thurmond Cathlyn LABOR., MD Visit Date: 12/08/2023 Occupation: @GUAROCC @  Subjective:  Medication management  History of Present Illness: Michelle Pierce is a 53 y.o. female with seronegative rheumatoid arthritis and osteoarthritis.  She returns today after last visit in February 2025.  She had a flare of rheumatoid arthritis on Sep 27, 2023.  At the time she was taking Enbrel  50 mg subcu every 10 days.  She was given a prednisone  taper and she was advised to decrease the interval between the Enbrel  injections to every 7 days.  She has been taking Enbrel  50 mg subcu weekly since then.  She had been doing well without any morning stiffness, joint pain or joint swelling since that flare.  She denies any recent infections.  She denies any shortness of breath or lymphadenopathy.    Activities of Daily Living:  Patient reports morning stiffness for 0  minute.   Patient Denies nocturnal pain.  Difficulty dressing/grooming: Denies Difficulty climbing stairs: Denies Difficulty getting out of chair: Denies Difficulty using hands for taps, buttons, cutlery, and/or writing: Denies  Review of Systems  Constitutional:  Negative for fatigue.  HENT:  Negative for mouth sores and mouth dryness.   Eyes:  Negative for dryness.  Respiratory:  Negative for difficulty breathing.   Cardiovascular:  Negative for palpitations.  Gastrointestinal:  Negative for blood in stool, constipation and diarrhea.  Endocrine: Negative for increased urination.  Genitourinary:  Negative for difficulty urinating.  Musculoskeletal:  Negative for joint pain, joint pain, joint swelling, myalgias, morning stiffness and myalgias.  Skin:  Negative for color change, rash and sensitivity to sunlight.  Allergic/Immunologic: Negative for susceptible to infections.   Neurological:  Negative for dizziness and headaches.  Hematological:  Negative for swollen glands.  Psychiatric/Behavioral:  Negative for depressed mood and sleep disturbance. The patient is not nervous/anxious.     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   Malaise and fatigue 07/27/2018   Overweight 07/27/2018   Dyspnea 02/06/2018   Stress at home 02/06/2018   Primary osteoarthritis of both hips 12/30/2016   History of scoliosis 12/30/2016   Elevated LFTs 12/30/2016   Sicca syndrome (HCC) 09/02/2016   Rheumatoid arthritis of multiple sites without rheumatoid factor (HCC) 04/08/2016   High risk medication use 04/08/2016   Osteoarthritis, 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    Past Medical History:  Diagnosis Date   Diverticulitis    per patient    Rheumatoid arthritis (HCC)     Family History  Problem Relation Age of Onset   Hypertension Mother    Irritable bowel syndrome Mother    Stomach cancer Father    Lung cancer Father    Diverticulitis Father    Sleep apnea Sister    Ulcerative colitis Son    Cancer Paternal Grandmother    Diverticulitis Paternal Grandmother    Hodgkin's lymphoma Paternal Grandmother    Prostate cancer Other        Aunts grandfather on dad side  Bone cancer Other    Diverticulitis Paternal Aunt        had colon surgery and colostomy    Past Surgical History:  Procedure Laterality Date   COLONOSCOPY  10/19/2019   [facility]. Mild diverticulosis was noted throughout the entire examined colon. The colon musoca was otherwise normal. Retroflexed views revealed internal Grade II second degree hemorrhoids.    KNEE ARTHROSCOPY Bilateral    Social History   Social History Narrative   Not on file    Immunization History  Administered Date(s) Administered   Influenza-Unspecified 03/07/2014, 02/24/2015, 02/22/2016   Moderna Sars-Covid-2 Vaccination 06/13/2020   PFIZER(Purple Top)SARS-COV-2 Vaccination 08/16/2019, 09/10/2019     Objective: Vital Signs: BP 118/73 (BP Location: Left Arm, Patient Position: Sitting, Cuff Size: Normal)   Pulse 82   Resp 16   Ht 5' 6 (1.676 m)   Wt 165 lb 3.2 oz (74.9 kg)   BMI 26.66 kg/m    Physical Exam Vitals and nursing note reviewed.  Constitutional:      Appearance: She is well-developed.  HENT:     Head: Normocephalic and atraumatic.  Eyes:     Conjunctiva/sclera: Conjunctivae normal.  Cardiovascular:     Rate and Rhythm: Normal rate and regular rhythm.     Heart sounds: Normal heart sounds.  Pulmonary:     Effort: Pulmonary effort is normal.     Breath sounds: Normal breath sounds.  Abdominal:     General: Bowel sounds are normal.     Palpations: Abdomen is soft.  Musculoskeletal:     Cervical back: Normal range of motion.  Lymphadenopathy:     Cervical: No cervical adenopathy.  Skin:    General: Skin is warm and dry.     Capillary Refill: Capillary refill takes less than 2 seconds.  Neurological:     Mental Status: She is alert and oriented to person, place, and time.  Psychiatric:        Behavior: Behavior normal.      Musculoskeletal Exam: Cervical spine was in good range of motion.  She had thoracolumbar scoliosis without any discomfort on range of motion.  Shoulders, elbows, wrist joints, MCPs PIPs and DIPs with good range of motion.  PIP and DIP thickening with no synovitis was noted.  Hip joints and knee joints in good range of motion.  She had no tenderness over her ankles or MTPs.  CDAI Exam: CDAI Score: -- Patient Global: --; Provider Global: -- Swollen: --; Tender: -- Joint Exam 12/08/2023   No joint exam has been documented for this visit   There is currently no information documented on the homunculus. Go  to the Rheumatology activity and complete the homunculus joint exam.  Investigation: No additional findings.  Imaging: No results found.  Recent Labs: Lab Results  Component Value Date   WBC 8.8 11/18/2023   HGB 13.5 11/18/2023   PLT 316 11/18/2023   NA 137 11/18/2023   K 4.7 11/18/2023   CL 101 11/18/2023   CO2 22 11/18/2023   GLUCOSE 110 (H) 11/18/2023   BUN 11 11/18/2023   CREATININE 0.69 11/18/2023   BILITOT 0.3 11/18/2023   ALKPHOS 82 11/18/2023   AST 19 11/18/2023   ALT 14 11/18/2023   PROT 6.8 11/18/2023   ALBUMIN 4.1 11/18/2023   CALCIUM 9.6 11/18/2023   GFRAA 127 11/26/2020   QFTBGOLDPLUS NEGATIVE 07/07/2023    Speciality Comments: No specialty comments available.  Procedures:  No procedures performed Allergies: Amoxicillin, Sulfa antibiotics, Gemifloxacin, and  Penicillins   Assessment / Plan:     Visit Diagnoses: Rheumatoid arthritis of multiple sites without rheumatoid factor (HCC)-patient had a flare in May while she was spacing Enbrel  to every 10 days.  She was given a prednisone  taper which she finished.  She was also advised to decrease the interval of Enbrel  shots to every 7 days.  She has been taking Enbrel  every 7 days now without any interruption.  She has not had any more flares of rheumatoid arthritis.  She denies morning stiffness, joint pain or joint swelling.  No synovitis was noted on the examination today.  High risk medication use-Enbrel  50 mg subcu every 7 days.  November 18, 2023 CBC and CMP were normal.  TB Gold was negative on July 07, 2023.  She was advised to get labs every 3 months.  Will get lipid panel with her next labs.  TB Gold was advised annually.  Information reimmunization was placed in the AVS.  She was advised to hold Enbrel  if she develops an infection resume after the infection resolves.  Increased risk of skin cancer with the Enbrel  was discussed.  Annual skin examination to screen for skin cancer and use of sunscreen and sun  protection was discussed.  Primary osteoarthritis of both hands-she has bilateral PIP and DIP thickening.  No synovitis was noted on the examination today.  Joint protection muscle strengthening was discussed.  Primary osteoarthritis of both hips-she had good range of motion of bilateral hip joints without any discomfort.  Chronic midline low back pain with left-sided sciatica-she continues to have some lower back pain.  She has thoracolumbar scoliosis.  She also wait tables.  Juvenile rheumatoid arthritis (HCC)  History of scoliosis-chronic pain.  Sicca syndrome (HCC)-she has dry mouth and dry eyes.  Over-the-counter products were discussed.  Dyslipidemia - Plan: Lipid panel, with next labs September  Diverticulitis-no recent episodes.  BMI 26.66.  Need for regular exercise and dietary modifications were discussed.  Orders: Orders Placed This Encounter  Procedures   Lipid panel   No orders of the defined types were placed in this encounter.    Follow-Up Instructions: Return in about 5 months (around 05/09/2024) for Rheumatoid arthritis.   Maya Nash, MD  Note - This record has been created using Animal nutritionist.  Chart creation errors have been sought, but may not always  have been located. Such creation errors do not reflect on  the standard of medical care.

## 2023-12-08 NOTE — Progress Notes (Deleted)
 error

## 2023-12-08 NOTE — Patient Instructions (Signed)
 Standing Labs We placed an order today for your standing lab work.   Please have your standing labs drawn in September and every 3 months  Please have your labs drawn 2 weeks prior to your appointment so that the provider can discuss your lab results at your appointment, if possible.  Please note that you may see your imaging and lab results in MyChart before we have reviewed them. We will contact you once all results are reviewed. Please allow our office up to 72 hours to thoroughly review all of the results before contacting the office for clarification of your results.  WALK-IN LAB HOURS  Monday through Thursday from 8:00 am -12:30 pm and 1:00 pm-4:30 pm and Friday from 8:00 am-12:00 pm.  Patients with office visits requiring labs will be seen before walk-in labs.  You may encounter longer than normal wait times. Please allow additional time. Wait times may be shorter on  Monday and Thursday afternoons.  We do not book appointments for walk-in labs. We appreciate your patience and understanding with our staff.   Labs are drawn by [facility]. Please bring your co-pay at the time of your lab draw.  You may receive a bill from [facility] for your lab work.  Please note if you are on Hydroxychloroquine and and an order has been placed for a Hydroxychloroquine level,  you will need to have it drawn 4 hours or more after your last dose.  If you wish to have your labs drawn at another location, please call the office 24 hours in advance so we can fax the orders.  The office is located at [address], [address], [place], [place] [address]   If you have any questions regarding directions or hours of operation,  please call [phone].   As a reminder, please drink plenty of water prior to coming for your lab work. Thanks!   Vaccines You are taking a medication(s) that can suppress your immune system.  The following immunizations are recommended: Flu annually Covid-19  Td/Tdap (tetanus,  diphtheria, pertussis) every 10 years Pneumonia (Prevnar 15 then Pneumovax 23 at least 1 year apart.  Alternatively, can take Prevnar 20 without needing additional dose) Shingrix: 2 doses from 4 weeks to 6 months apart  Please check with your PCP to make sure you are up to date.   If you have signs or symptoms of an infection or start antibiotics: First, call your PCP for workup of your infection. Hold your medication through the infection, until you complete your antibiotics, and until symptoms resolve if you take the following: Injectable medication (Actemra, Benlysta, Cimzia, Cosentyx, Enbrel , Humira, Kevzara, Orencia, Remicade, Simponi, Stelara, Taltz, Tremfya) Methotrexate  Leflunomide (Arava) Mycophenolate (Cellcept) Earma, Olumiant, or Rinvoq   Please get an annual skin examination to screen for skin cancer.  Please use sunscreen and sun protection.

## 2023-12-26 ENCOUNTER — Other Ambulatory Visit: Payer: Self-pay | Admitting: Physician Assistant

## 2023-12-26 NOTE — Telephone Encounter (Signed)
 Last Fill: 11/07/2023  Labs: 11/18/2023 CBC stable CMP WNL  TB Gold: 07/07/2023 TB Gold Negative    Next Visit: 05/10/2024  Last Visit: 12/08/2023  IK:Myzlfjunpi arthritis of multiple sites without rheumatoid factor   Current Dose per office note 12/08/2023: Enbrel  50 mg subcu every 7 days   Okay to refill Enbrel ?

## 2023-12-29 ENCOUNTER — Ambulatory Visit: Admitting: Gastroenterology

## 2024-01-04 ENCOUNTER — Ambulatory Visit: Admitting: Physician Assistant

## 2024-03-01 ENCOUNTER — Telehealth: Payer: Self-pay | Admitting: *Deleted

## 2024-03-01 NOTE — Telephone Encounter (Signed)
 Patient contacted the office requesting a call back. Patient states she was seen at the health clinic at her job. Patient states the tip of her finger is infected. Patient states she was placed on Clindamycin for 7 days. Patient is on Enbrel . Patient advised she will need to hold her Enbrel  until she has completed the antibiotic and the infection has resolved.

## 2024-04-23 ENCOUNTER — Other Ambulatory Visit: Payer: Self-pay | Admitting: Physician Assistant

## 2024-04-23 DIAGNOSIS — Z79899 Other long term (current) drug therapy: Secondary | ICD-10-CM

## 2024-04-23 NOTE — Telephone Encounter (Signed)
 Last Fill: 12/26/2023  Labs: 11/18/2023 CBC stable CMP WNL  TB Gold: 07/07/2023 TB Gold is negative.   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   Patient to update labs at upcomming appointment.   Okay to refill Enbrel ?

## 2024-04-26 NOTE — Progress Notes (Unsigned)
 Office Visit Note  Patient: Michelle Pierce             Date of Birth: Jul 22, 1970           MRN: [id number]             PCP: Thurmond Cathlyn LABOR., MD Referring: Thurmond Cathlyn LABOR., MD Visit Date: 05/10/2024 Occupation: IT  Subjective:  Medication monitoring   History of Present Illness: ROSHINI FULWIDER is a 53 y.o. female with history of seronegative rheumatoid arthritis and osteoarthritis.  Patient remains on enbrel  50 mg sq injections once weekly.  She is tolerating Enbrel  without any side effects or injection site reactions.  She has not had any recent gaps in therapy.  Patient reports that she was diagnosed with a paronychia affecting the right middle finger on 02/29/2024.  She was treated with a course of clindamycin.  Patient states that on 04/11/2024 she was diagnosed with sinusitis and was treated with a course of doxycycline.  She denies any other recent or recurrent infections.  Patient states that she held Enbrel  while being treated with these courses of antibiotics but has otherwise not had any gaps in therapy.  She denies any morning stiffness, nocturnal pain, difficulty performing ADLs.  She has not had any signs or symptoms of a rheumatoid arthritis flare.  Activities of Daily Living:  Patient reports morning stiffness for 0 minutes  Patient Denies nocturnal pain.  Difficulty dressing/grooming: Denies Difficulty climbing stairs: Denies Difficulty getting out of chair: Denies Difficulty using hands for taps, buttons, cutlery, and/or writing: Denies  Review of Systems  Constitutional:  Negative for fatigue.  HENT:  Negative for mouth sores, mouth dryness and nose dryness.   Eyes:  Positive for dryness (Restasis). Negative for pain and visual disturbance.  Respiratory:  Negative for cough, hemoptysis, shortness of breath and difficulty breathing.   Cardiovascular:  Negative for chest pain, palpitations, hypertension and swelling in legs/feet.  Gastrointestinal:  Negative for  blood in stool, constipation and diarrhea.  Endocrine: Negative for increased urination.  Genitourinary:  Negative for painful urination.  Musculoskeletal:  Negative for joint pain, joint pain, joint swelling, myalgias, muscle weakness, morning stiffness, muscle tenderness and myalgias.  Skin:  Negative for color change, pallor, rash, hair loss, nodules/bumps, skin tightness, ulcers and sensitivity to sunlight.  Allergic/Immunologic: Negative for susceptible to infections.  Neurological:  Negative for dizziness, numbness, headaches and weakness.  Hematological:  Negative for swollen glands.  Psychiatric/Behavioral:  Negative for depressed mood and sleep disturbance. The patient is not nervous/anxious.     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   Malaise and fatigue 07/27/2018   Overweight 07/27/2018   Dyspnea 02/06/2018   Stress at home 02/06/2018   Primary osteoarthritis of both hips 12/30/2016   History of scoliosis 12/30/2016   Elevated LFTs 12/30/2016   Sicca syndrome 09/02/2016   Rheumatoid arthritis of multiple sites without rheumatoid factor (HCC) 04/08/2016   High risk medication use 04/08/2016   Osteoarthritis, 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    Past Medical History:  Diagnosis Date   Diverticulitis    per patient    Rheumatoid arthritis (HCC)     Family History  Problem Relation Age of Onset   Hypertension Mother    Irritable bowel syndrome Mother  Stomach cancer Father    Lung cancer Father    Diverticulitis Father    Sleep apnea Sister    Ulcerative colitis Son    Cancer Paternal Grandmother    Diverticulitis Paternal Grandmother    Hodgkin's lymphoma Paternal Grandmother    Prostate cancer Other        Aunts  grandfather on dad side    Bone cancer Other    Diverticulitis Paternal Aunt        had colon surgery and colostomy    Past Surgical History:  Procedure Laterality Date   COLONOSCOPY  10/19/2019   [facility]. Mild diverticulosis was noted throughout the entire examined colon. The colon musoca was otherwise normal. Retroflexed views revealed internal Grade II second degree hemorrhoids.    KNEE ARTHROSCOPY Bilateral    Social History   Tobacco Use   Smoking status: Never    Passive exposure: Never   Smokeless tobacco: Never  Vaping Use   Vaping status: Never Used  Substance Use Topics   Alcohol use: No   Drug use: No   Social History   Social History Narrative   Not on file     Immunization History  Administered Date(s) Administered   Influenza-Unspecified 03/07/2014, 02/24/2015, 02/22/2016   Moderna Sars-Covid-2 Vaccination 06/13/2020   PFIZER(Purple Top)SARS-COV-2 Vaccination 08/16/2019, 09/10/2019     Objective: Vital Signs: BP 121/76   Pulse 71   Temp 97.6 F (36.4 C)   Resp 14   Ht 5' 6 (1.676 m)   Wt 172 lb 3.2 oz (78.1 kg)   BMI 27.79 kg/m    Physical Exam Vitals and nursing note reviewed.  Constitutional:      Appearance: She is well-developed.  HENT:     Head: Normocephalic and atraumatic.  Eyes:     Conjunctiva/sclera: Conjunctivae normal.  Cardiovascular:     Rate and Rhythm: Normal rate and regular rhythm.     Heart sounds: Normal heart sounds.  Pulmonary:     Effort: Pulmonary effort is normal.     Breath sounds: Normal breath sounds.  Abdominal:     General: Bowel sounds are normal.     Palpations: Abdomen is soft.  Musculoskeletal:     Cervical back: Normal range of motion.  Lymphadenopathy:     Cervical: No cervical adenopathy.  Skin:    General: Skin is warm and dry.     Capillary Refill: Capillary refill takes less than 2 seconds.  Neurological:     Mental Status: She is alert and oriented to person, place, and time.   Psychiatric:        Behavior: Behavior normal.      Musculoskeletal Exam: C-spine has good range of motion.  Trapezius muscle tension and tenderness bilaterally.  Thoracolumbar scoliosis.  Shoulder joints, elbow joints, wrist joints, MCPs, PIPs, DIPs have good range of motion with no synovitis.  Complete fist formation bilaterally.  PIP and DIP thickening consistent with osteoarthritis of both hands.  Hip joints have good range of motion with no groin pain.  Tenderness along the IT band bilaterally.  Knee joints have good range of motion no warmth or effusion.  Ankle joints have good range of motion no tenderness or joint swelling.  No evidence of Achilles tendinitis or plantar fasciitis.   CDAI Exam: CDAI Score: -- Patient Global: --; Provider Global: -- Swollen: --; Tender: -- Joint Exam 05/10/2024   No joint exam has been documented for this visit   There is currently no information  documented on the homunculus. Go to the Rheumatology activity and complete the homunculus joint exam.  Investigation: No additional findings.  Imaging: No results found.  Recent Labs: Lab Results  Component Value Date   WBC 8.8 11/18/2023   HGB 13.5 11/18/2023   PLT 316 11/18/2023   NA 137 11/18/2023   K 4.7 11/18/2023   CL 101 11/18/2023   CO2 22 11/18/2023   GLUCOSE 110 (H) 11/18/2023   BUN 11 11/18/2023   CREATININE 0.69 11/18/2023   BILITOT 0.3 11/18/2023   ALKPHOS 82 11/18/2023   AST 19 11/18/2023   ALT 14 11/18/2023   PROT 6.8 11/18/2023   ALBUMIN 4.1 11/18/2023   CALCIUM 9.6 11/18/2023   GFRAA 127 11/26/2020   QFTBGOLDPLUS NEGATIVE 07/07/2023    Speciality Comments: No specialty comments available.  Procedures:  No procedures performed Allergies: Amoxicillin, Sulfa antibiotics, Gemifloxacin, and Penicillins    Assessment / Plan:     Visit Diagnoses: Rheumatoid arthritis of multiple sites without rheumatoid factor (HCC): She has no synovitis on examination today.  She has  not had any signs or symptoms of a rheumatoid arthritis flare.  She has not had any morning stiffness, nocturnal pain, or difficulty performing ADLs.  She has clinically been doing well on Enbrel  50 mg subcutaneous injections once weekly.  She is tolerating Enbrel  without any side effects or injection site reactions.  Patient was treated with a course of clindamycin for a paronychia affecting the right middle finger on 02/29/2024 and was treated with a course of doxycycline for sinusitis on 04/10/2024.  Patient held Enbrel  while taking these course of antibiotics but has otherwise not had any gaps in therapy.  No medication changes will be made at this time.  She was advised to notify us  if she develops any signs or symptoms of a flare.  She will follow-up in the office in 5 months or sooner if needed.  High risk medication use - Enbrel  50 mg sq injections every 7 days CBC and CMP updated on 11/18/23.  Orders for CBC and CMP released today  TB gold negative on 07/07/23 Discussed the importance of holding enbrel  if she develops signs or symptoms of an infection and to resume once the infection has completely  cleared.  Patient received the annual flu shot.  - Plan: CBC with Differential/Platelet, Comprehensive metabolic panel with GFR, QuantiFERON-TB Gold Plus  Screening for tuberculosis - Future order for TB gold placed today.  Plan: QuantiFERON-TB Gold Plus  Primary osteoarthritis of both hands: She has PIP and DIP thickening consistent with osteoarthritis of both hands.  No tenderness or synovitis noted.  Complete fist formation bilaterally.  Primary osteoarthritis of both hips: No groin pain currently.  She has intermittent discomfort affecting both hips.  She has some tenderness along the IT band bilaterally.  Encourage patient to perform stretching exercises daily.  Chronic midline low back pain with left-sided sciatica - She has thoracolumbar scoliosis: No symptoms of radiculopathy.  Other  medical conditions are listed as follows:   Juvenile rheumatoid arthritis (HCC)  History of scoliosis  Sicca syndrome  Dyslipidemia  Diverticulitis   Orders: Orders Placed This Encounter  Procedures   CBC with Differential/Platelet   Comprehensive metabolic panel with GFR   QuantiFERON-TB Gold Plus   No orders of the defined types were placed in this encounter.    Follow-Up Instructions: No follow-ups on file.   Waddell CHRISTELLA Craze, PA-C  Note - This record has been created using Dragon software.  Chart creation errors have been sought, but may not always  have been located. Such creation errors do not reflect on  the standard of medical care.

## 2024-05-10 ENCOUNTER — Ambulatory Visit: Admitting: Physician Assistant

## 2024-05-10 ENCOUNTER — Encounter: Payer: Self-pay | Admitting: Physician Assistant

## 2024-05-10 VITALS — BP 121/76 | HR 71 | Temp 97.6°F | Resp 14 | Ht 66.0 in | Wt 172.2 lb

## 2024-05-10 DIAGNOSIS — Z79899 Other long term (current) drug therapy: Secondary | ICD-10-CM | POA: Diagnosis not present

## 2024-05-10 DIAGNOSIS — M0609 Rheumatoid arthritis without rheumatoid factor, multiple sites: Secondary | ICD-10-CM

## 2024-05-10 DIAGNOSIS — M19042 Primary osteoarthritis, left hand: Secondary | ICD-10-CM | POA: Diagnosis not present

## 2024-05-10 DIAGNOSIS — M160 Bilateral primary osteoarthritis of hip: Secondary | ICD-10-CM

## 2024-05-10 DIAGNOSIS — K5792 Diverticulitis of intestine, part unspecified, without perforation or abscess without bleeding: Secondary | ICD-10-CM

## 2024-05-10 DIAGNOSIS — M0800 Unspecified juvenile rheumatoid arthritis of unspecified site: Secondary | ICD-10-CM

## 2024-05-10 DIAGNOSIS — M3500 Sicca syndrome, unspecified: Secondary | ICD-10-CM | POA: Diagnosis not present

## 2024-05-10 DIAGNOSIS — M19041 Primary osteoarthritis, right hand: Secondary | ICD-10-CM | POA: Diagnosis not present

## 2024-05-10 DIAGNOSIS — M5442 Lumbago with sciatica, left side: Secondary | ICD-10-CM

## 2024-05-10 DIAGNOSIS — Z8739 Personal history of other diseases of the musculoskeletal system and connective tissue: Secondary | ICD-10-CM | POA: Diagnosis not present

## 2024-05-10 DIAGNOSIS — Z111 Encounter for screening for respiratory tuberculosis: Secondary | ICD-10-CM | POA: Diagnosis not present

## 2024-05-10 DIAGNOSIS — E785 Hyperlipidemia, unspecified: Secondary | ICD-10-CM

## 2024-05-10 DIAGNOSIS — G8929 Other chronic pain: Secondary | ICD-10-CM

## 2024-05-10 NOTE — Patient Instructions (Signed)
 Standing Labs We placed an order today for your standing lab work.   Please have your standing labs drawn in March and every 3 months   Please have your labs drawn 2 weeks prior to your appointment so that the provider can discuss your lab results at your appointment, if possible.  Please note that you may see your imaging and lab results in MyChart before we have reviewed them. We will contact you once all results are reviewed. Please allow our office up to 72 hours to thoroughly review all of the results before contacting the office for clarification of your results.  WALK-IN LAB HOURS  Monday through Thursday from 8:00 am - 4:30 pm and Friday from 8:00 am-12:00 pm.  Patients with office visits requiring labs will be seen before walk-in labs.  You may encounter longer than normal wait times. Please allow additional time. Wait times may be shorter on  Monday and Thursday afternoons.  We do not book appointments for walk-in labs. We appreciate your patience and understanding with our staff.   Labs are drawn by [facility]. Please bring your co-pay at the time of your lab draw.  You may receive a bill from [facility] for your lab work.  Please note if you are on Hydroxychloroquine and and an order has been placed for a Hydroxychloroquine level,  you will need to have it drawn 4 hours or more after your last dose.  If you wish to have your labs drawn at another location, please call the office 24 hours in advance so we can fax the orders.  The office is located at [address], [place], [place] [address]   If you have any questions regarding directions or hours of operation,  please call [phone].   As a reminder, please drink plenty of water  prior to coming for your lab work. Thanks!

## 2024-05-11 ENCOUNTER — Ambulatory Visit: Payer: Self-pay | Admitting: Physician Assistant

## 2024-05-11 LAB — COMPREHENSIVE METABOLIC PANEL WITH GFR
AG Ratio: 1.6 (calc) (ref 1.0–2.5)
ALT: 31 U/L — ABNORMAL HIGH (ref 6–29)
AST: 28 U/L (ref 10–35)
Albumin: 4.5 g/dL (ref 3.6–5.1)
Alkaline phosphatase (APISO): 115 U/L (ref 37–153)
BUN: 12 mg/dL (ref 7–25)
CO2: 29 mmol/L (ref 20–32)
Calcium: 9.5 mg/dL (ref 8.6–10.4)
Chloride: 103 mmol/L (ref 98–110)
Creat: 0.58 mg/dL (ref 0.50–1.03)
Globulin: 2.8 g/dL (ref 1.9–3.7)
Glucose, Bld: 88 mg/dL (ref 65–99)
Potassium: 4.4 mmol/L (ref 3.5–5.3)
Sodium: 140 mmol/L (ref 135–146)
Total Bilirubin: 0.5 mg/dL (ref 0.2–1.2)
Total Protein: 7.3 g/dL (ref 6.1–8.1)
eGFR: 109 mL/min/1.73m2

## 2024-05-11 LAB — CBC WITH DIFFERENTIAL/PLATELET
Absolute Lymphocytes: 2439 {cells}/uL (ref 850–3900)
Absolute Monocytes: 463 {cells}/uL (ref 200–950)
Basophils Absolute: 53 {cells}/uL (ref 0–200)
Basophils Relative: 0.6 %
Eosinophils Absolute: 214 {cells}/uL (ref 15–500)
Eosinophils Relative: 2.4 %
HCT: 41.7 % (ref 35.9–46.0)
Hemoglobin: 13.7 g/dL (ref 11.7–15.5)
MCH: 29.1 pg (ref 27.0–33.0)
MCHC: 32.9 g/dL (ref 31.6–35.4)
MCV: 88.5 fL (ref 81.4–101.7)
MPV: 10.1 fL (ref 7.5–12.5)
Monocytes Relative: 5.2 %
Neutro Abs: 5732 {cells}/uL (ref 1500–7800)
Neutrophils Relative %: 64.4 %
Platelets: 307 Thousand/uL (ref 140–400)
RBC: 4.71 Million/uL (ref 3.80–5.10)
RDW: 11.5 % (ref 11.0–15.0)
Total Lymphocyte: 27.4 %
WBC: 8.9 Thousand/uL (ref 3.8–10.8)

## 2024-05-11 NOTE — Progress Notes (Signed)
 CBC WNL ALT is borderline elevated. AST WNL. Rest of CMP WNL.  We will continue to monitor.

## 2024-05-14 MED ORDER — METHOCARBAMOL 500 MG PO TABS: 500.0000 mg | ORAL_TABLET | Freq: Every day | ORAL | 0 refills | Status: AC | PRN

## 2024-05-14 NOTE — Telephone Encounter (Signed)
 Ok to send in robaxin  500 mg 1 tablet daily as needed for muscle spasms. 30 tablets with zero refills.

## 2024-05-18 ENCOUNTER — Other Ambulatory Visit: Payer: Self-pay | Admitting: Physician Assistant

## 2024-05-18 NOTE — Telephone Encounter (Signed)
 Last Fill: 04/23/2024  Labs: 05/10/2024 CBC WNL ALT is borderline elevated. AST WNL. Rest of CMP WNL.  We will continue to monitor.  TB Gold: 07/07/2023 TB Gold Negative   Next Visit: 10/09/2024  Last Visit: 05/10/2024  IK:Myzlfjunpi arthritis of multiple sites without rheumatoid factor   Current Dose per office note 05/10/2024: Enbrel  50 mg sq injections every 7 days   Okay to refill Enbrel ?

## 2024-10-09 ENCOUNTER — Ambulatory Visit: Admitting: Physician Assistant
